# Patient Record
Sex: Male | Born: 1955 | Race: White | Hispanic: No | Smoking: Never smoker
Health system: Southern US, Community
[De-identification: ages and names within clinical notes are randomized; demographics above are authoritative.]

## PROBLEM LIST (undated history)

## (undated) DIAGNOSIS — E669 Obesity, unspecified: Secondary | ICD-10-CM

## (undated) DIAGNOSIS — Z87442 Personal history of urinary calculi: Secondary | ICD-10-CM

## (undated) DIAGNOSIS — Z113 Encounter for screening for infections with a predominantly sexual mode of transmission: Secondary | ICD-10-CM

## (undated) DIAGNOSIS — F3289 Other specified depressive episodes: Secondary | ICD-10-CM

## (undated) DIAGNOSIS — Z0000 Encounter for general adult medical examination without abnormal findings: Secondary | ICD-10-CM

## (undated) DIAGNOSIS — M5412 Radiculopathy, cervical region: Secondary | ICD-10-CM

## (undated) DIAGNOSIS — F329 Major depressive disorder, single episode, unspecified: Secondary | ICD-10-CM

## (undated) DIAGNOSIS — R209 Unspecified disturbances of skin sensation: Secondary | ICD-10-CM

## (undated) DIAGNOSIS — I10 Essential (primary) hypertension: Secondary | ICD-10-CM

## (undated) DIAGNOSIS — E291 Testicular hypofunction: Secondary | ICD-10-CM

## (undated) DIAGNOSIS — G4700 Insomnia, unspecified: Secondary | ICD-10-CM

## (undated) DIAGNOSIS — R0789 Other chest pain: Secondary | ICD-10-CM

## (undated) HISTORY — DX: Personal history of urinary calculi: Z87.442

## (undated) HISTORY — DX: Encounter for screening for infections with a predominantly sexual mode of transmission: Z11.3

## (undated) HISTORY — DX: Unspecified disturbances of skin sensation: R20.9

## (undated) HISTORY — DX: Essential (primary) hypertension: I10

## (undated) HISTORY — DX: Other chest pain: R07.89

## (undated) HISTORY — DX: Encounter for general adult medical examination without abnormal findings: Z00.00

## (undated) HISTORY — DX: Other specified depressive episodes: F32.89

## (undated) HISTORY — DX: Testicular hypofunction: E29.1

## (undated) HISTORY — DX: Radiculopathy, cervical region: M54.12

## (undated) HISTORY — DX: Insomnia, unspecified: G47.00

## (undated) HISTORY — DX: Major depressive disorder, single episode, unspecified: F32.9

## (undated) HISTORY — DX: Obesity, unspecified: E66.9

---
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
## 2005-02-13 ENCOUNTER — Ambulatory Visit (HOSPITAL_COMMUNITY): Admission: RE | Admit: 2005-02-13 | Discharge: 2005-02-13 | Payer: Self-pay | Admitting: Neurology

## 2008-11-03 ENCOUNTER — Encounter: Payer: Self-pay | Admitting: Family Medicine

## 2008-11-03 LAB — CONVERTED CEMR LAB
ALT: 25 units/L
AST: 18 units/L
Albumin: 4.7 g/dL
Alkaline Phosphatase: 84 units/L
BUN: 15 mg/dL
Calcium: 9.4 mg/dL
Cholesterol: 151 mg/dL
Creatinine, Ser: 0.89 mg/dL
HDL: 49 mg/dL
LDL Cholesterol: 59 mg/dL
PSA: 0.47 ng/mL
Total Bilirubin: 0.6 mg/dL
Total Protein: 7.2 g/dL
Triglycerides: 216 mg/dL

## 2009-05-26 LAB — HM COLONOSCOPY: HM Colonoscopy: NORMAL

## 2009-09-21 ENCOUNTER — Encounter: Payer: Self-pay | Admitting: Family Medicine

## 2009-09-21 LAB — CONVERTED CEMR LAB
Chloride: 107 meq/L
HCT: 43.8 %
Hemoglobin: 15 g/dL
Platelets: 269 10*3/uL
Potassium: 4.3 meq/L
RBC: 4.82 M/uL
Sodium: 141 meq/L
T3 Uptake Ratio: 30.4 %
T4, Total: 7.4 ug/dL
TSH: 1.275 microintl units/mL
WBC: 8.2 10*3/uL

## 2009-12-13 ENCOUNTER — Ambulatory Visit: Payer: Self-pay | Admitting: Internal Medicine

## 2009-12-13 DIAGNOSIS — I10 Essential (primary) hypertension: Secondary | ICD-10-CM

## 2009-12-13 DIAGNOSIS — F329 Major depressive disorder, single episode, unspecified: Secondary | ICD-10-CM

## 2009-12-13 DIAGNOSIS — G4700 Insomnia, unspecified: Secondary | ICD-10-CM

## 2009-12-13 DIAGNOSIS — R209 Unspecified disturbances of skin sensation: Secondary | ICD-10-CM | POA: Insufficient documentation

## 2009-12-14 ENCOUNTER — Telehealth: Payer: Self-pay | Admitting: Family Medicine

## 2009-12-17 ENCOUNTER — Encounter: Payer: Self-pay | Admitting: Family Medicine

## 2010-01-17 ENCOUNTER — Ambulatory Visit: Payer: Self-pay | Admitting: Internal Medicine

## 2010-02-08 ENCOUNTER — Ambulatory Visit: Payer: Self-pay | Admitting: Internal Medicine

## 2010-02-08 LAB — CONVERTED CEMR LAB
BUN: 17 mg/dL (ref 6–23)
CO2: 29 meq/L (ref 19–32)
Calcium: 9.5 mg/dL (ref 8.4–10.5)
Chloride: 108 meq/L (ref 96–112)
Cholesterol: 187 mg/dL (ref 0–200)
Creatinine, Ser: 1 mg/dL (ref 0.4–1.5)
GFR calc non Af Amer: 83.1 mL/min (ref >60)
Glucose, Bld: 104 mg/dL — ABNORMAL HIGH (ref 70–99)
HDL: 50.4 mg/dL (ref >39.00)
LDL Cholesterol: 109 mg/dL — ABNORMAL HIGH (ref 0–99)
Potassium: 4.2 meq/L (ref 3.5–5.1)
Sodium: 143 meq/L (ref 135–145)
Total CHOL/HDL Ratio: 4
Triglycerides: 137 mg/dL (ref 0.0–149.0)
VLDL: 27.4 mg/dL (ref 0.0–40.0)

## 2010-02-17 ENCOUNTER — Ambulatory Visit: Payer: Self-pay | Admitting: Internal Medicine

## 2010-02-17 DIAGNOSIS — R0789 Other chest pain: Secondary | ICD-10-CM

## 2010-02-17 DIAGNOSIS — E669 Obesity, unspecified: Secondary | ICD-10-CM

## 2010-04-22 ENCOUNTER — Encounter: Payer: Self-pay | Admitting: Family Medicine

## 2010-04-22 ENCOUNTER — Ambulatory Visit: Payer: Self-pay | Admitting: Family Medicine

## 2010-04-26 LAB — CONVERTED CEMR LAB
ALT: 27 units/L (ref 0–53)
AST: 27 units/L (ref 0–37)
Albumin: 4.1 g/dL (ref 3.5–5.2)
Alkaline Phosphatase: 74 units/L (ref 39–117)
BUN: 19 mg/dL (ref 6–23)
Bilirubin, Direct: 0.1 mg/dL (ref 0.0–0.3)
CO2: 31 meq/L (ref 19–32)
Calcium: 9.3 mg/dL (ref 8.4–10.5)
Chloride: 103 meq/L (ref 96–112)
Creatinine, Ser: 1 mg/dL (ref 0.4–1.5)
GFR calc non Af Amer: 78.45 mL/min (ref >60.00)
Glucose, Bld: 98 mg/dL (ref 70–99)
HCV Ab: NEGATIVE
HIV: NONREACTIVE
Hep A IgM: NEGATIVE
Hep B C IgM: NEGATIVE
Hepatitis B Surface Ag: NEGATIVE
Potassium: 3.8 meq/L (ref 3.5–5.1)
Sodium: 142 meq/L (ref 135–145)
Total Bilirubin: 0.8 mg/dL (ref 0.3–1.2)
Total Protein: 6.9 g/dL (ref 6.0–8.3)

## 2010-06-07 NOTE — Assessment & Plan Note (Signed)
Summary: ONE MTH F/U FOR HTN & DEPRESSION / LFW   Vital Signs:  Patient profile:   55 year old male Weight:      229.25 pounds BMI:     33.98 Temp:     98.2 degrees F oral Pulse rate:   72 / minute Pulse rhythm:   regular BP sitting:   138 / 80  (left arm) Cuff size:   large  Vitals Entered By: Selena Batten Dance CMA (AAMA) (February 17, 2010 8:08 AM)  Serial Vital Signs/Assessments:  Time      Position  BP       Pulse  Resp  Temp     By                     130/90                         Eustaquio Boyden  MD  CC: 1 month follow up   History of Present Illness: CC: f/u HTN  1. insomnia - sleeping better even off ambien.  will continue trying to wean.  2. Depression - off celexa for last 2-3 days.  thinks would like to taper off med and see how he does with depression off meds.  previously 2/2 wife's passing.  3. obesity - drinks tea and lemonade.    4. HTN - no HA, vision changes, chest pain, tightness, urinary changes, LE swelling.  + right rib pain when laying on right side in bed.  comes and goes away easily.  achey.  positional.  5. hand numbness improved.  Allergies: No Known Drug Allergies  Past History:  Family History: Last updated: 12/13/2009 Father - HTN?, EtOH, Parkinson's Disease, 57yo deceased  No CA, DM, CAD/MI, CVA.  Social History: Last updated: 12/17/2009 No smoking, occ EtOH, no rec drugs. Wife passed away suddenly Nov 19, 2008 from cirrhosis/fulminant acute liver failure (from Hep C?).  Since then pt with sleep issues and depression. 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 EOMI. Perrla. Mouth:  Oral mucosa and oropharynx without lesions or exudates.  Teeth in good repair. Neck:  No deformities, masses, or tenderness noted.  no thyromegaly.  no bruits Lungs:  Normal respiratory effort, chest expands symmetrically. Lungs are clear to auscultation, no crackles or wheezes. Heart:  Normal rate and regular rhythm. S1 and S2 normal without gallop, murmur, click, rub or other extra sounds. Pulses:  2+ periph pulses   Impression & Recommendations:  Problem # 1:  INSOMNIA (ICD-780.52) improving off ambien.  rec wean off. His updated medication list for this problem includes:    Ambien 10 Mg Tabs (Zolpidem tartrate) .Marland Kitchen... 1 by mouth at bedtime  Problem # 2:  HYPERTENSION (ICD-401.9) reviewed blood work and provided with copy.  continue HCTZ as helping BP.  His updated medication list for this problem includes:    Hydrochlorothiazide 12.5 Mg Caps (Hydrochlorothiazide) .Marland Kitchen... Take one daily in am for blood pressure  BP today: 138/80 Prior BP: 150/90 (01/17/2010)  Labs Reviewed:  K+: 4.2 (02/08/2010) Creat: : 1.0 (02/08/2010)   Chol: 187 (02/08/2010)   HDL: 50.40 (02/08/2010)   LDL: 109 (02/08/2010)   TG: 137.0 (02/08/2010)  Problem # 3:  DEPRESSION (ICD-311) pt wants to taper of med.  discussed tapering schedule for celexa. His updated medication list for this problem includes:    Citalopram Hydrobromide 20 Mg Tabs (Citalopram hydrobromide) .Marland Kitchen... 1 once daily  Problem # 4:  NUMBNESS, HAND (ICD-782.0) improved.  Problem # 5:  CHEST PAIN, ATYPICAL (ICD-786.59) noncardiac in nature, not exertional, not relieved by rest.  sounds more MSK, monitor for now.  Problem # 6:  OBESITY (ICD-278.00)  to try artificial sweeteners, cut back on sugars and increase water.  interested in weight loss.  prediabetes by fasting cbg 104.  Ht: 69.0 (12/13/2009)    Wt: 229.25 (02/17/2010)   BMI: 33.98 (02/17/2010)  Complete Medication List: 1)  Citalopram Hydrobromide 20 Mg Tabs (Citalopram hydrobromide) .Marland Kitchen.. 1 once daily 2)  Ambien 10 Mg Tabs (Zolpidem tartrate) .Marland Kitchen.. 1 by mouth at bedtime 3)  Hydrochlorothiazide 12.5 Mg Caps (Hydrochlorothiazide) .... Take one daily in am for blood pressure  Patient Instructions: 1)  return in 3-6 months for follow up, sooner if needed. 2)  cut back on celexa to 1/2 pill for a week then 1/2 pill every other day for a week then stop. 3)  Continue HCTZ for blood pressure.   4)  Good to see you today, call clinic with quesitons.   Current Allergies (reviewed today): No known allergies

## 2010-06-07 NOTE — Assessment & Plan Note (Signed)
Summary: new patient to establish/alc   Vital Signs:  Patient profile:   55 year old male Height:      69.0 inches Weight:      229 pounds BMI:     33.94 Temp:     99.0 degrees F oral Pulse rate:   76 / minute Pulse rhythm:   regular BP sitting:   130 / 84  (left arm) Cuff size:   large  Vitals Entered By: Selena Batten Dance CMA Duncan Dull) (December 13, 2009 9:24 AM) CC: New patient to establish   History of Present Illness: CC: establish care, new patient  1. insomnia - previously oleptra/trazodone but didn't help.  Now on ambien and doing well as far as going to sleep, but waking up at 3am daily.  Started since wife passed.  2. depression - since wife passed.  + trouble sleeping.  + anhedonia.  + guilt.  decreased energy level.  Normal concentration.  Appetite OK.  No SI/HI.  On 20mg  daily of Celexa.  Going to grievance counseling through Hospice.  Has appt for Wed.  Going to breakfast mens' club once a month.  3. HTN - started since wife passed.  Was on telmisartan/amlodipine samples from prior provider.  no HA, vision changes, chest pain, tightness, SOB, urinary changes, LE swelling.  4. left fingers numb - started several months ago.  Started with pain from shoulder down arm, now just numb.  No weakness, elbow pain.  Worse with lifting hand.  Thinks elbow rests on padded support.  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  Wife passed 12-13-08 (quite suddenly, dx with ESLD 11-13-2008, died 1 mo later).  Still coping with loss.  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  Daughter married in [place].  Poor social support. Trying to move on, has girlfriend (for 57mo).  Feb went to life screen and told everything normal (AAA [place], ABIs, etc).  Preventive Screening-Counseling & Management  Alcohol-Tobacco     Alcohol drinks/day: <1     Smoking Status: never  Caffeine-Diet-Exercise     Caffeine use/day: 2-3/day  Safety-Violence-Falls     Seat  Belt Use: no     Seat Belt Counseling: to use seat belts when in vehicle      Drug Use:  never.        Blood Transfusions:  no.    Current Medications (verified): 1)  Twynsta 80-10 Mg Tabs (Telmisartan-Amlodipine) .... 1/2 Tablet Once Daily 2)  Citalopram Hydrobromide 20 Mg Tabs (Citalopram Hydrobromide) .Marland Kitchen.. 1 Once Daily 3)  Zolpidem Tartrate 10 Mg Tabs (Zolpidem Tartrate) .Marland Kitchen.. 1 At Bedtime As Needed  Allergies (verified): No Known Drug Allergies  Past History:  Past Medical History: HTN Insomnia since wife passed Depression h/o nephrolithiasis  Past Surgical History: T&A 1962  scrotal US - 9/22 with ?cystocele  Family History: Father - HTN?, EtOH, Parkinson's Disease, 57yo deceased  No CA, DM, CAD/MI, CVA.  Social History: No smoking, occ EtOH, no rec drugs. Wife passed away suddenly 2008/12/13 from cirrhosis/fulminant acute liver failure.  Since then pt with sleep issues and depression. 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  The patient denies anorexia, fever, weight loss, weight gain, vision loss, decreased hearing, hoarseness, chest pain,  syncope, dyspnea on exertion, peripheral edema, prolonged cough, headaches, hemoptysis, abdominal pain, melena, hematochezia, severe indigestion/heartburn, hematuria, incontinence, genital sores, muscle weakness, suspicious skin lesions, transient blindness, difficulty walking, unusual weight change, abnormal bleeding, angioedema, and testicular masses.    Physical Exam  General:  Well-developed,well-nourished,in no acute distress; alert,appropriate and cooperative throughout examination Head:  Normocephalic and atraumatic without obvious abnormalities. No apparent alopecia or balding. Eyes:  No corneal or conjunctival inflammation noted. EOMI.  Perrla. Mouth:  Oral mucosa and oropharynx without lesions or exudates.  Teeth in good repair. Neck:  No deformities, masses, or tenderness noted.  no thyromegaly Lungs:  Normal respiratory effort, chest expands symmetrically. Lungs are clear to auscultation, no crackles or wheezes. Heart:  Normal rate and regular rhythm. S1 and S2 normal without gallop, murmur, click, rub or other extra sounds. Abdomen:  Bowel sounds positive,abdomen soft and non-tender without masses, organomegaly or hernias noted. Msk:  No deformity or scoliosis noted of thoracic or lumbar spine.    Left hand - no muscle wasting.  + numbness to light touch left 5th finger palmar and dorsal sides, 4th finger palmar side, also ulnar side of palm.  Strength 5/5 bilateral hands.  Right hand - WNL Pulses:  2+ periph pulses Extremities:  No clubbing, cyanosis, edema, or deformity noted with normal full range of motion of all joints.   Neurologic:  see above.   Impression & Recommendations:  Problem # 1:  HYPERTENSION (ICD-401.9) obtain records from Dr. Carron Brazen office. His updated medication list for this problem includes:    Zestoretic 10-12.5 Mg Tabs (Lisinopril-hydrochlorothiazide) .Marland Kitchen... Take one daily for blood pressure.  BP today: 130/84  Problem # 2:  DEPRESSION (ICD-311) meets criteria for depression.  continue celexa.  consider CBT next visit.  pt seems open to this.  currently receiving grief counseling through hospice. His updated medication list for this problem includes:    Citalopram Hydrobromide 20 Mg Tabs (Citalopram hydrobromide) .Marland Kitchen... 1 once daily  Problem # 3:  INSOMNIA (ICD-780.52) trouble with sleep maintenance.  trial of long acting ambien.  discuss sleep hygiene at next visit, consider trazodone trial again with titration up. His updated medication list for this problem includes:    Zolpidem Tartrate 12.5 Mg Cr-tabs (Zolpidem tartrate) ..... 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  check sperling's next visit to help r/o cervical, but don't think coming from back.  treat with ensuring good padding at elbow, minimize elbow flexion.    Problem # 5:  Preventive Health Care (ICD-V70.0) per pt colonoscopy last year.  obtain records.  due for prostate discussion as well.  check last tetanus.  Complete Medication List: 1)  Zestoretic 10-12.5 Mg Tabs (Lisinopril-hydrochlorothiazide) .... Take one daily for blood pressure. 2)  Citalopram Hydrobromide 20 Mg Tabs (Citalopram hydrobromide) .Marland Kitchen.. 1 once daily 3)  Zolpidem Tartrate 12.5 Mg Cr-tabs (Zolpidem tartrate) .... One by mouth at bedtime as needed insomnia  Patient Instructions: 1)  Release of information for colonosocopy results Camera operator in [place]) and records from Dr. Lacie Scotts. 2)  Please return to see me in 1 month for follow up of medicines and HTN. 3)  Start medicine HCTZ 12.5mg  daily for blood pressure. 4)  Continue celexa. 5)  Pleasure to meet you today.  Call clinic with questions. Prescriptions: ZOLPIDEM TARTRATE 12.5 MG CR-TABS (ZOLPIDEM TARTRATE) one by mouth at bedtime as needed insomnia  #30 x 0   Entered  and Authorized by:   Eustaquio Boyden  MD   Signed by:   Eustaquio Boyden  MD on 12/13/2009   Method used:   Print then Give to Patient   RxID:   [id number] ZESTORETIC 10-12.5 MG TABS (LISINOPRIL-HYDROCHLOROTHIAZIDE) take one daily for blood pressure.  #30 x 2   Entered and Authorized by:   Eustaquio Boyden  MD   Signed by:   Eustaquio Boyden  MD on 12/13/2009   Method used:   Electronically to        [facility] [address] [phone]* (retail)       [address] [place], [place]  [address]       Ph: [phone]       Fax: [phone]   RxID:   [id number]   Current Allergies (reviewed today): No known allergies    Prevention & Chronic Care Immunizations   Influenza vaccine: Not documented    Tetanus  booster: Not documented    Pneumococcal vaccine: Not documented  Colorectal Screening   Hemoccult: Not documented    Colonoscopy: Not documented  Other Screening   PSA: Not documented   Smoking status: never  (12/13/2009)  Lipids   Total Cholesterol: Not documented   LDL: Not documented   LDL Direct: Not documented   HDL: Not documented   Triglycerides: Not documented  Hypertension   Last Blood Pressure: 130 / 84  (12/13/2009)   Serum creatinine: Not documented   Serum potassium Not documented  Self-Management Support :    Hypertension self-management support: Not documented  Appended Document: colonoscopy report    Clinical Lists Changes  Observations: Added new observation of PAST SURG HX: T&A 1962  scrotal US - 9/22 with ?cystocele Colonoscopy 05/2009 WNL by Dr. Niel Hummer at [facility] S[facility] center (12/13/2009 17:28) Added new observation of HEMOCCRECACT: Not indicated (12/13/2009 17:28) Added new observation of DM PROGRESS: N/A (12/13/2009 17:28) Added new observation of DM FSREVIEW: N/A (12/13/2009 17:28) Added new observation of LIPID PROGRS: N/A (12/13/2009 17:28) Added new observation of LIPID FSREVW: N/A (12/13/2009 17:28) Added new observation of COLONNXTDUE: 06/2019 (12/13/2009 17:28) Added new observation of COLONRECACT: Repeat colonoscopy in 10 years.   (05/26/2009 17:29) Added new observation of COLONOSCOPY:  Results: Normal. Results: Internal Hemorrhoids.  (05/26/2009 17:29)        Past History:  Past Surgical History: T&A 1962  scrotal US - 9/22 with ?cystocele Colonoscopy 05/2009 WNL by Dr. Niel Hummer at [facility] Surg center   Colonoscopy  Procedure date:  05/26/2009  Findings:       Results: Normal. Results: Internal Hemorrhoids.   Comments:      Repeat colonoscopy in 10 years.    Procedures Next Due Date:    Colonoscopy: 06/2019    Prevention & Chronic Care Immunizations   Influenza vaccine: Not  documented    Tetanus booster: Not documented    Pneumococcal vaccine: Not documented  Colorectal Screening   Hemoccult: Not documented   Hemoccult action/deferral: Not indicated  (12/13/2009)    Colonoscopy:  Results: Normal. Results: Internal Hemorrhoids.   (05/26/2009)   Colonoscopy action/deferral: Repeat colonoscopy in 10 years.    (05/26/2009)   Colonoscopy due: 06/2019  Other Screening   PSA: Not documented   Smoking status: never  (12/13/2009)  Lipids   Total Cholesterol: Not documented   LDL: Not documented   LDL Direct: Not documented   HDL: Not documented   Triglycerides: Not documented  Hypertension   Last Blood  Pressure: 130 / 84  (12/13/2009)   Serum creatinine: Not documented   Serum potassium Not documented  Self-Management Support :    Hypertension self-management support: Not documented

## 2010-06-07 NOTE — Miscellaneous (Signed)
Summary: needs FLP, BMP next visit  Clinical Lists Changes  Needs BMP, FLP at next visit.  Ryan Boyden  MD  December 17, 2009 10:44 AM   Observations: Added new observation of PAST SURG HX: T&A 1962  Cspine 1993 - mod degen changes with mild neural foraminal impingement C4-5 B, C5-6 L renal US 2004 - WNL scrotal US - 01/27/09 with ?cystocele Colonoscopy 05/2009 WNL by Dr. Niel Hummer at [facility] (12/17/2009 10:31) Added new observation of PAST MED HX: HTN Insomnia since wife passed Depression h/o borderline hypogonadism, normal on latest check per urology h/o nephrolithiasis h/o scrotal mass, seen by Urology Evelene Croon with [place], will monitor "fluid filled sack" (12/17/2009 10:31) Added new observation of SOCIAL HX: No smoking, occ EtOH, no rec drugs. Wife passed away suddenly November 27, 2008 from cirrhosis/fulminant acute liver failure (from Hep C?).  Since then pt with sleep issues and depression. 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) Added new observation of PLATELETK/UL: 269 K/uL (09/21/2009 10:31) Added new observation of HCT: 43.8 % (09/21/2009 10:31) Added new observation of HGB: 15.0 g/dL (16/02/9603 54:09) Added new observation of RBC M/UL: 4.82 M/uL (09/21/2009 10:31) Added new observation of WBC COUNT: 8.2 10*3/microliter (09/21/2009 10:31) Added new observation of TSH: 1.275 microintl units/mL (09/21/2009 10:31) Added new observation of T3 UPTAKE S: 30.4 % (09/21/2009 10:31) Added new observation of T4, TOTAL: 7.4 mcg/dL (81/19/1478 29:56) Added new observation of CL SERUM: 107 meq/L (09/21/2009 10:31) Added new observation of K SERUM: 4.3 meq/L (09/21/2009 10:31) Added new observation of NA: 141 meq/L (09/21/2009 10:31) Added new observation of PSA: 0.47 ng/mL (11/03/2008 10:31) Added new observation of CALCIUM: 9.4 mg/dL (21/30/8657 84:69) Added new observation of ALBUMIN: 4.7 g/dL (62/95/2841 32:44) Added new observation of PROTEIN,  TOT: 7.2 g/dL (05/10/7251 66:44) Added new observation of SGPT (ALT): 25 units/L (11/03/2008 10:31) Added new observation of SGOT (AST): 18 units/L (11/03/2008 10:31) Added new observation of ALK PHOS: 84 units/L (11/03/2008 10:31) Added new observation of BILI TOTAL: 0.6 mg/dL (03/47/4259 56:38) Added new observation of CREATININE: 0.89 mg/dL (75/64/3329 51:88) Added new observation of BUN: 15 mg/dL (41/66/0630 16:01) Added new observation of LDL: 59 mg/dL (09/32/3557 32:20) Added new observation of HDL: 49 mg/dL (25/42/7062 37:62) Added new observation of TRIGLYC TOT: 216 mg/dL (83/15/1761 60:73) Added new observation of CHOLESTEROL: 151 mg/dL (71/10/2692 85:46) Testosterone, Total: 264.27 ng/dl (27/07/5007)       Past History:  Past Medical History: HTN Insomnia since wife passed Depression h/o borderline hypogonadism, normal on latest check per urology h/o nephrolithiasis h/o scrotal mass, seen by Urology Evelene Croon with [place], will monitor "fluid filled sack"  Past Surgical History: T&A 1962  Cspine 1993 - mod degen changes with mild neural foraminal impingement C4-5 B, C5-6 L renal US 2004 - WNL scrotal US - 01/27/09 with ?cystocele Colonoscopy 05/2009 [facility] by [facility]. Niel Hummer at [facility] center   Social History: No smoking, occ EtOH, no rec drugs. Wife passed away suddenly November 27, 2008 from cirrhosis/fulminant acute liver failure (from Hep C?).  Since then pt with sleep issues and depression. 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

## 2010-06-07 NOTE — Progress Notes (Signed)
Summary: Remus Loffler cr is too expensive  Phone Note Call from Patient Call back at Home Phone [phone] Call back at [phone]   Caller: Patient Call For: Eustaquio Boyden  MD Summary of Call: Pt was given script for ambien cr but this is too expensive.  He is asking if something less costly can be called to [facility] [place].  Please let pt know. Initial call taken by: Lowella Petties CMA,  December 14, 2009 2:04 PM  Follow-up for Phone Call        Stickney CR, lunesta too expensive.  consider trial silenor in future (although likely expensive as well).  Trial of amitriptyline at bedtime as needed.     Follow-up by: Eustaquio Boyden  MD,  December 14, 2009 10:23 PM  Additional Follow-up for Phone Call Additional follow up Details #1::        We have samples of the Silenor 3mg  and 6mg . Do you want me to give him samples of that to see how it works for him or just call in the amitriptyline to his pharmacy?   Nvm- i just saw where you had sent it yourself!  Additional Follow-up by: Janee Morn CMA Duncan Dull),  December 15, 2009 8:08 AM    Additional Follow-up for Phone Call Additional follow up Details #2::    can we call him and let him know?  thanks.  Left message on machine to return call. Kim Dance CMA Duncan Dull)  December 15, 2009 8:17 AM  Follow-up by: Eustaquio Boyden  MD,  December 15, 2009 8:14 AM  Additional Follow-up for Phone Call Additional follow up Details #3:: Details for Additional Follow-up Action Taken: Spoke with patient and made him aware of the new Rx. Additional Follow-up by: Janee Morn CMA (AAMA),  December 15, 2009 4:00 PM  New/Updated Medications: AMITRIPTYLINE HCL 25 MG TABS (AMITRIPTYLINE HCL) one by mouth at bedtime as needed insomnia Prescriptions: AMITRIPTYLINE HCL 25 MG TABS (AMITRIPTYLINE HCL) one by mouth at bedtime as needed insomnia  #30 x 1   Entered and Authorized by:   Eustaquio Boyden  MD   Signed by:   Eustaquio Boyden  MD on 12/14/2009   Method used:    Electronically to        [facility] [address] [phone]* (retail)       [address] [place]  [address]       Ph: [phone]       Fax: [phone]   RxID:   [id number]

## 2010-06-07 NOTE — Assessment & Plan Note (Signed)
Summary: ONE MTH F/U OF MEDS and HTN / LFW   Vital Signs:  Patient profile:   55 year old male Weight:      230.50 pounds Temp:     98.7 degrees F oral Pulse rate:   84 / minute Pulse rhythm:   regular BP sitting:   150 / 90  (left arm) Cuff size:   large  Vitals Entered By: Selena Batten Dance CMA Duncan Dull) (January 17, 2010 9:09 AM)  Serial Vital Signs/Assessments:  Time      Position  BP       Pulse  Resp  Temp     By                     160/96                         Eustaquio Boyden  MD  CC: 1 month follow up   History of Present Illness: CC: f/u HTN  1. 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  Previously on benicar but didn't work enough so previous PCP had given him telmisartan/amlodipine samples.  no HA, vision changes, chest pain, tightness, urinary changes, LE swelling.    2. Depression - since wife passed >1 year ago now.  Improved sleep, improved guilt.  still with anhedonia.  decreased energy level.  Normal concentration.  Appetite OK.  No SI/HI.  On 20mg  daily of Celexa.  Stopped grievance counseling through [facility].  Stoppd going to breakfast mens[employer] club (was yongest one there, didn't feel was really getting much out of it).  3. insomnia - previously on oleptra/trazodone but didn't help.  Now on [place].  Sleep actually notices improvement - not using as frequently.  will try and wean off but would like refill.  didn't fill amitriptyline because read about it being antidepressant, already on celexa.  Wife passed 11/2008 (quite suddenly, dx with ESLD 11-04-2008, died 1 mo later).  Still coping with loss.  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  Daughter married in [place].  Poor social support. Trying to move on, has girlfriend (for 72mo).  Feb went to life screen and told everything normal (AAA [place], ABIs, etc).  Current Medications (verified): 1)  Citalopram Hydrobromide 20 Mg Tabs (Citalopram Hydrobromide) .Marland Kitchen.. 1 Once Daily 2)  Ambien 10  Mg Tabs (Zolpidem Tartrate) .Marland Kitchen.. 1 By Mouth At Bedtime 3)  Hydrochlorothiazide 12.5 Mg Caps (Hydrochlorothiazide) .... Take One Daily in Am For Blood Pressure  Allergies (verified): No Known Drug Allergies  Past History:  Past medical, surgical, family and social histories (including risk factors) reviewed for relevance to current acute and chronic problems.  Past Medical History: HTN Depression    Insomnia since wife passed MILD hypertriglyceridemia h/o borderline hypogonadism, normal on latest check per urology h/o nephrolithiasis h/o scrotal mass, seen by Urology Evelene Croon with [place], will monitor "fluid filled sack"  Past Surgical History: Reviewed history from 12/17/2009 and no changes required. T&A 1962  Cspine 1993 - mod degen changes with mild neural foraminal impingement C4-5 B, C5-6 L renal US 2004 - WNL scrotal US - 01/27/09 with ?cystocele Colonoscopy 05/2009 WNL by Dr. Niel Hummer at [facility] center  Social History: Reviewed history from 12/17/2009 and no changes required. No smoking, occ EtOH, no rec drugs. Wife passed away suddenly 12/03/08 from cirrhosis/fulminant acute liver failure (from Hep C?).  Since then pt with sleep issues and depression.  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 Lungs are clear to auscultation, no crackles or wheezes. Heart:  Normal rate and regular rhythm. S1 and S2 normal without gallop, murmur, click, rub or other extra sounds. Pulses:  2+ periph pulses Psych:  Cognition and judgment appear intact.  No apparent delusions, illusions, hallucinations.  tired appearing, mildly flattened affect.   Impression & Recommendations:  Problem # 1:  HYPERTENSION (ICD-401.9) zestoretic too strong.  BP  remains elevated.  Start slow, low with HCTZ 12.5.  RTC 3 wks for blood work to eval Ca.  The following medications were removed from the medication list:    Zestoretic 10-12.5 Mg Tabs (Lisinopril-hydrochlorothiazide) .Marland Kitchen... Take one daily for blood pressure. His updated medication list for this problem includes:    Hydrochlorothiazide 12.5 Mg Caps (Hydrochlorothiazide) .Marland Kitchen... Take one daily in am for blood pressure  BP today: 150/90 Prior BP: 130/84 (12/13/2009)  Labs Reviewed: K+: 4.3 (09/21/2009) Creat: : 0.89 (11/03/2008)   Chol: 151 (11/03/2008)   HDL: 49 (11/03/2008)   LDL: 59 (11/03/2008)   TG: 216 (11/03/2008)  Problem # 2:  DEPRESSION (ICD-311) continue celexa.  pt will give grieving more time.  discussed other options of increasing celexa vs adjunctive treatment vs CBT.    The following medications were removed from the medication list:    Amitriptyline Hcl 25 Mg Tabs (Amitriptyline hcl) ..... One by mouth at bedtime as needed insomnia His updated medication list for this problem includes:    Citalopram Hydrobromide 20 Mg Tabs (Citalopram hydrobromide) .Marland Kitchen... 1 once daily  Problem # 3:  INSOMNIA (ICD-780.52) ambien CR too expensive.  Actually sleep better.  requests refill of Remus Loffler, will try to taper off. consider trazodone with titration upward His updated medication list for this problem includes:    Ambien 10 Mg Tabs (Zolpidem tartrate) .Marland Kitchen... 1 by mouth at bedtime  Problem # 4:  Preventive Health Care (ICD-V70.0) due for tetanus, flu.  Complete Medication List: 1)  Citalopram Hydrobromide 20 Mg Tabs (Citalopram hydrobromide) .Marland Kitchen.. 1 once daily 2)  Ambien 10 Mg Tabs (Zolpidem tartrate) .Marland Kitchen.. 1 by mouth at bedtime 3)  Hydrochlorothiazide 12.5 Mg Caps (Hydrochlorothiazide) .... Take one daily in am for blood pressure  Patient Instructions: 1)  return in 3 weeks fasting for blood work [BMP, FLP 401.1]  2)  return in 1 month for f/u HTN, depression. 3)  Pleasure to see you  today.  call clinic with questions. Prescriptions: CITALOPRAM HYDROBROMIDE 20 MG TABS (CITALOPRAM HYDROBROMIDE) 1 once daily  #30 x 3   Entered and Authorized by:   Eustaquio Boyden  MD   Signed by:   Eustaquio Boyden  MD on 01/17/2010   Method used:   Electronically to        [facility] [address] [phone]* (retail)       [address] [place], [place]  [address]       Ph: [phone]       Fax: [phone]   RxID:   [id number] AMBIEN 10 MG TABS (ZOLPIDEM TARTRATE) 1 by mouth at bedtime  #30 x 0   Entered and Authorized by:   Eustaquio Boyden  MD   Signed by:   Eustaquio Boyden  MD on 01/17/2010   Method used:  Print then Give to Patient   RxID:   [id number] AMBIEN 10 MG TABS (ZOLPIDEM TARTRATE) 1 by mouth at bedtime  #0 x 0   Entered and Authorized by:   Eustaquio Boyden  MD   Signed by:   Eustaquio Boyden  MD on 01/17/2010   Method used:   Print then Give to Patient   RxID:   [id number] HYDROCHLOROTHIAZIDE 12.5 MG CAPS (HYDROCHLOROTHIAZIDE) take one daily in am for blood pressure  #30 x 2   Entered and Authorized by:   Eustaquio Boyden  MD   Signed by:   Eustaquio Boyden  MD on 01/17/2010   Method used:   Electronically to        [facility] [address] [phone]* (retail)       [address] [place], [place]  [address]       Ph: [phone]       Fax: [phone]   RxID:   [id number]   Current Allergies (reviewed today): No known allergies    Prevention & Chronic Care Immunizations   Influenza vaccine: Not documented    Tetanus booster: Not documented    Pneumococcal vaccine: Not documented  Colorectal Screening   Hemoccult: Not documented   Hemoccult action/deferral: Not indicated  (12/13/2009)    Colonoscopy:  Results: Normal. Results: Internal Hemorrhoids.   (05/26/2009)   Colonoscopy action/deferral: Repeat colonoscopy in 10 years.    (05/26/2009)   Colonoscopy due: 06/2019  Other Screening   PSA: 0.47   (11/03/2008)   Smoking status: never  (12/13/2009)  Lipids   Total Cholesterol: 151  (11/03/2008)   LDL: 59  (11/03/2008)   LDL Direct: Not documented   HDL: 49  (11/03/2008)   Triglycerides: 216  (11/03/2008)  Hypertension   Last Blood Pressure: 150 / 90  (01/17/2010)   Serum creatinine: 0.89  (11/03/2008)   Serum potassium 4.3  (09/21/2009)  Self-Management Support :    Hypertension self-management support: Not documented

## 2010-06-09 NOTE — Assessment & Plan Note (Signed)
Summary: DISCUSS HEP B TESTING/ lb   Vital Signs:  Patient profile:   55 year old male Height:      69.0 inches Weight:      235.50 pounds BMI:     34.90 Temp:     98.7 degrees F oral Pulse rate:   84 / minute Pulse rhythm:   regular BP sitting:   136 / 82  (left arm) Cuff size:   large  Vitals Entered By: Lewanda Rife LPN (April 22, 2010 12:21 PM) CC: Discuss Hepatitis C testing   History of Present Illness: C: f/u, discuss Hep C   1. depression - celexa 20mg  1/2 every other day.  for last week.  Sleeping ok.  To do one more week and then stop and see how he does.  2. HTN - taking HTCZ 12.5mg  daily.  no HA, vision changes, chest pain, tightness, urinary changes, LE swelling.  3. requests Hep C testing - wife with "autoimmune hep C" died from ESLD 10 days ago.  would like Hep screen.  currently sexually active - GF with hysterectomy, not using protection.  01/2010 had spots on penis.  Checked by Dr. Gailen Shelter, done [place] for hydrocele, told had yeast infection, resolved with antifungal.  No current spots on skin.  no h/o STDs.  No dysuria or discharge.  No fevers/chills, no further skin lesions.  Allergies (verified): No Known Drug Allergies  Past History:  Past Medical History: HTN Depression    Insomnia since wife passed h/o borderline hypogonadism, normal on latest check per urology h/o nephrolithiasis h/o what sounds like hydrocele [Wolff] h/o R brachial neuritis resolved [Weymann]  Past Surgical History: T&A 1962  Cspine 1993 - mod degen changes with mild neural foraminal impingement C4-5 B, C5-6 L renal US 2004 - WNL scrotal US - 01/27/09 with ?cystocele per report Colonoscopy 05/2009 WNL by Dr. Niel Hummer at [facility] center  Social History: No smoking, occ EtOH, no rec drugs. Wife passed away suddenly 2008-11-29 from cirrhosis/fulminant acute liver failure (from Hep C vs autoimmune?).  Since then pt with sleep issues and depression. Local farmer  raises cows and soybean  Review of Systems       per HPI  Physical Exam  General:  Well-developed,well-nourished,in no acute distress; alert,appropriate and cooperative throughout examination Psych:  Cognition and judgment appear intact.  No apparent delusions, illusions, hallucinations.  brighter affect today, tears with discussion of wife's passing   Impression & Recommendations:  Problem # 1:  SCREENING EXAMINATION FOR VENEREAL DISEASE (ICD-V74.5) Assessment New check HIV, RPR, hep panel per patient's request.  as well as CMP (last done 2010). Orders: T-HIV Antibody  (Reflex) [id number]) T-Hepatitis Acute Panel ([id number]) T-RPR (Syphilis) (95638-75643)  Problem # 2:  HYPERTENSION (ICD-401.9) Assessment: Improved continue HCTZ daily.  stable on med.  His updated medication list for this problem includes:    Hydrochlorothiazide 12.5 Mg Caps (Hydrochlorothiazide) .Marland Kitchen... Take one daily in am for blood pressure  Orders: TLB-BMP (Basic Metabolic Panel-BMET) (80048-METABOL) TLB-Hepatic/Liver Function Pnl (80076-HEPATIC)  BP today: 136/82 Prior BP: 138/80 (02/17/2010)  Labs Reviewed: K+: 4.2 (02/08/2010) Creat: : 1.0 (02/08/2010)   Chol: 187 (02/08/2010)   HDL: 50.40 (02/08/2010)   LDL: 109 (02/08/2010)   TG: 137.0 (02/08/2010)  Problem # 3:  DEPRESSION (ICD-311) Assessment: Improved improved with time.  pt tapering off.  stop celexa next week.   His updated medication list for this problem includes:    Citalopram Hydrobromide 20 Mg Tabs (Citalopram hydrobromide) .Marland Kitchen... 1/2  tablet by mouth every other day.  Complete Medication List: 1)  Citalopram Hydrobromide 20 Mg Tabs (Citalopram hydrobromide) .... 1/2 tablet by mouth every other day. 2)  Hydrochlorothiazide 12.5 Mg Caps (Hydrochlorothiazide) .... Take one daily in am for blood pressure 3)  Ibuprofen 200 Mg Tabs (Ibuprofen) .... Otc as directed.  Patient Instructions: 1)  STD screen today - HIV, syphillis,  hepatitis. 2)  celexa 1/2 every other day for next week then stop. 3)  Good to see you today, call clinic with quesitons. Prescriptions: HYDROCHLOROTHIAZIDE 12.5 MG CAPS (HYDROCHLOROTHIAZIDE) take one daily in am for blood pressure  #30 x 5   Entered and Authorized by:   Eustaquio Boyden  MD   Signed by:   Eustaquio Boyden  MD on 04/22/2010   Method used:   Print then Give to Patient   RxID:   [id number]    Orders Added: 1)  TLB-BMP (Basic Metabolic Panel-BMET) [80048-METABOL] 2)  TLB-Hepatic/Liver Function Pnl [80076-HEPATIC] 3)  T-HIV Antibody  (Reflex) [[id number]] 4)  T-Hepatitis Acute Panel [80074-22940] 5)  T-RPR (Syphilis) [30865-78469] 6)  Est. Patient Level III [62952]    Current Allergies (reviewed today): No known allergies   Appended Document: DISCUSS HEP B TESTING/ lb

## 2010-06-14 ENCOUNTER — Ambulatory Visit (INDEPENDENT_AMBULATORY_CARE_PROVIDER_SITE_OTHER): Payer: Self-pay | Admitting: Family Medicine

## 2010-06-14 ENCOUNTER — Encounter: Payer: Self-pay | Admitting: Family Medicine

## 2010-06-14 DIAGNOSIS — F329 Major depressive disorder, single episode, unspecified: Secondary | ICD-10-CM

## 2010-06-14 DIAGNOSIS — I10 Essential (primary) hypertension: Secondary | ICD-10-CM

## 2010-06-23 NOTE — Assessment & Plan Note (Signed)
Summary: 3-6 month f/u [facility]   Vital Signs:  Patient profile:   55 year old male Weight:      234.25 pounds Temp:     98.5 degrees F oral Pulse rate:   80 / minute Pulse rhythm:   regular BP sitting:   128 / 78  (left arm) Cuff size:   large  Vitals Entered By: Selena Batten Dance CMA Duncan Dull) (June 14, 2010 8:17 AM) CC: Follow up   History of Present Illness: CC: f/u HTN   1. HTN - no HA, vision changes, chest pain, tightness, urinary changes, LE swelling.    2. depression - off celexa.  doing well, no mood issues.  3. obesity - stable weight.  active job - works in farm.  no exercise otherwise.  preventative: colonoscopy 05/2009 all looking good.  told looking good for 10 years. teatnus - unsure when last one was.  declines currently.  working on Museum/gallery curator.   Current Medications (verified): 1)  Hydrochlorothiazide 12.5 Mg Caps (Hydrochlorothiazide) .... Take One Daily in Am For Blood Pressure 2)  Ibuprofen 200 Mg Tabs (Ibuprofen) .... Otc As Directed.  Allergies (verified): No Known Drug Allergies  Past History:  Past Medical History: Last updated: 04/22/2010 HTN Depression    Insomnia since wife passed h/o borderline hypogonadism, normal on latest check per urology h/o nephrolithiasis h/o what sounds like hydrocele [Wolff] h/o R brachial neuritis resolved [Weymann]  Family History: Last updated: 12/13/2009 Father - HTN?, EtOH, Parkinson's Disease, 57yo deceased  No CA, DM, CAD/MI, CVA.  Social History: Last updated: 04/22/2010 No smoking, occ EtOH, no rec drugs. Wife passed away suddenly 2008/11/22 from cirrhosis/fulminant acute liver failure (from Hep C vs autoimmune?).  Since then pt with sleep issues and depression. Local farmer raises cows and soybean   Review of Systems       per HPI   Physical Exam  General:  Well-developed,well-nourished,in no acute distress; alert,appropriate and cooperative throughout examination Mouth:  Oral mucosa and  oropharynx without lesions or exudates.  Teeth in good repair. Neck:  No deformities, masses, or tenderness noted.  no thyromegaly.  no bruits Lungs:  Normal respiratory effort, chest expands symmetrically. Lungs are clear to auscultation, no crackles or wheezes. Heart:  Normal rate and regular rhythm. S1 and S2 normal without gallop, murmur, click, rub or other extra sounds. Abdomen:  Bowel sounds positive,abdomen soft and non-tender without masses, organomegaly or hernias noted. Pulses:  2+ rad pulses Extremities:  no pedal edema Skin:  Intact without suspicious lesions or rashes   Impression & Recommendations:  Problem # 1:  HYPERTENSION (ICD-401.9) Assessment Improved good control, continue meds.  His updated medication list for this problem includes:    Hydrochlorothiazide 12.5 Mg Caps (Hydrochlorothiazide) .Marland Kitchen... Take one daily in am for blood pressure  BP today: 128/78 Prior BP: 136/82 (04/22/2010)  Labs Reviewed: K+: 3.8 (04/22/2010) Creat: : 1.0 (04/22/2010)   Chol: 187 (02/08/2010)   HDL: 50.40 (02/08/2010)   LDL: 109 (02/08/2010)   TG: 137.0 (02/08/2010)  Problem # 2:  DEPRESSION (ICD-311) Assessment: Improved stable off meds.  previously situational at wife's passing.  The following medications were removed from the medication list:    Citalopram Hydrobromide 20 Mg Tabs (Citalopram hydrobromide) .Marland Kitchen... 1/2 tablet by mouth every other day.  Problem # 3:  OBESITY (ICD-278.00) discussed less salt, more fruits/vegetables.  Ht: 69.0 (04/22/2010)   Wt: 234.25 (06/14/2010)   BMI: 34.90 (04/22/2010)  Complete Medication List: 1)  Hydrochlorothiazide 12.5 Mg Caps (  Hydrochlorothiazide) .... Take one daily in am for blood pressure 2)  Ibuprofen 200 Mg Tabs (Ibuprofen) .... Otc as directed.   Patient Instructions: 1)  return at your convenience for CPE. 2)  return for bp f/u in 6 months. 3)  Stay away from salt, try to increase exercise and more fruits and vegetables. 4)   Good ot see you today, call clinic with quesitons   Orders Added: 1)  Est. Patient Level III [91478]  Current Allergies (reviewed today): No known allergies   Prevention & Chronic Care Immunizations   Influenza vaccine: Not documented    Tetanus booster: Not documented    Pneumococcal vaccine: Not documented  Colorectal Screening   Hemoccult: Not documented   Hemoccult action/deferral: Not indicated  (12/13/2009)    Colonoscopy:  Results: Normal. Results: Internal Hemorrhoids.   (05/26/2009)   Colonoscopy action/deferral: Repeat colonoscopy in 10 years.    (05/26/2009)   Colonoscopy due: 06/2019  Other Screening   PSA: 0.47  (11/03/2008)   Smoking status: never  (12/13/2009)  Lipids   Total Cholesterol: 187  (02/08/2010)   LDL: 109  (02/08/2010)   LDL Direct: Not documented   HDL: 50.40  (02/08/2010)   Triglycerides: 137.0  (02/08/2010)  Hypertension   Last Blood Pressure: 128 / 78  (06/14/2010)   Serum creatinine: 1.0  (04/22/2010)   Serum potassium 3.8  (04/22/2010)  Self-Management Support :    Hypertension self-management support: Not documented

## 2010-08-01 ENCOUNTER — Encounter: Payer: Self-pay | Admitting: Family Medicine

## 2010-08-03 ENCOUNTER — Other Ambulatory Visit: Payer: Self-pay

## 2010-08-08 ENCOUNTER — Encounter: Payer: Self-pay | Admitting: Family Medicine

## 2010-09-01 ENCOUNTER — Telehealth: Payer: Self-pay | Admitting: *Deleted

## 2010-09-02 MED ORDER — ZOLPIDEM TARTRATE 10 MG PO TABS: ORAL_TABLET | ORAL | Status: DC

## 2010-09-02 NOTE — Telephone Encounter (Signed)
Will send in but as temporary measure.  If insomnia continues, will need to discuss.

## 2010-09-02 NOTE — Telephone Encounter (Signed)
Message left for patient to call back and advise pharmacy for call in.

## 2010-09-05 NOTE — Telephone Encounter (Signed)
Rx called in as directed.   

## 2010-09-05 NOTE — Telephone Encounter (Signed)
Pt called back, uses [facility] [address]

## 2010-10-24 ENCOUNTER — Other Ambulatory Visit: Payer: Self-pay | Admitting: *Deleted

## 2010-10-24 MED ORDER — HYDROCHLOROTHIAZIDE 12.5 MG PO CAPS: 12.5000 mg | ORAL_CAPSULE | Freq: Every day | ORAL | Status: DC

## 2010-12-05 ENCOUNTER — Encounter: Payer: Self-pay | Admitting: Family Medicine

## 2010-12-05 ENCOUNTER — Ambulatory Visit (INDEPENDENT_AMBULATORY_CARE_PROVIDER_SITE_OTHER): Payer: Self-pay | Admitting: Family Medicine

## 2010-12-05 DIAGNOSIS — R079 Chest pain, unspecified: Secondary | ICD-10-CM

## 2010-12-05 DIAGNOSIS — R0789 Other chest pain: Secondary | ICD-10-CM

## 2010-12-05 MED ORDER — ALPRAZOLAM 0.5 MG PO TABS: 0.2500 mg | ORAL_TABLET | Freq: Three times a day (TID) | ORAL | Status: DC | PRN

## 2010-12-05 NOTE — Progress Notes (Signed)
Chest pain.  5-15 minutes of SSCP, sometimes better with drinking fluids.  A few episodes over the weekend.  No sx before the weekends.  No exertional sx.  Had been working in the heat w/o chest pain.  Inc in social stress.  Trouble with girlfriend, was engaged but that was broken off . Now with roundup resistant weeds in crops.    No SI/HI.  He contracts for safety.   No FH CAD.    PMH and SH reviewed  ROS: See HPI, otherwise noncontributory.  Meds, vitals, and allergies reviewed.   GEN: nad, alert and oriented, obese HEENT: mucous membranes moist NECK: supple w/o LA CV: rrr.  Chest wall not ttp PULM: ctab, no inc wob ABD: soft, +bs EXT: no edema SKIN: no acute rash  CP-similar to recent sx- here in clinic, better with GI cocktail.  Rapid relief.   EKG reviewed.

## 2010-12-05 NOTE — Patient Instructions (Signed)
Schedule a visit with Dr. Reece Agar later this week.   Take the xanax as needed (sedation caution) and start on omeprazole 20mg  a day.  Take care.

## 2010-12-06 ENCOUNTER — Encounter: Payer: Self-pay | Admitting: Family Medicine

## 2010-12-06 NOTE — Assessment & Plan Note (Signed)
I talked to pt about this in the clinic.  He called back and we talked more about his social stress.  This is atypical for cardiac source.  No exertional sx, better with GI cocktail.  High social stress.  Use PPI and xanax for presumed heartburn and anxiety, contracts for safety and f/u with me later in the week.  He may need to restart celexa.  He'll bring his old rx to the the next OV.  He agrees with plan.  >25 min spent with face to face with patient, >50% counseling and/or coordinating care.

## 2010-12-08 ENCOUNTER — Encounter: Payer: Self-pay | Admitting: Family Medicine

## 2010-12-08 ENCOUNTER — Ambulatory Visit (INDEPENDENT_AMBULATORY_CARE_PROVIDER_SITE_OTHER): Payer: BC Managed Care – PPO | Admitting: Family Medicine

## 2010-12-08 ENCOUNTER — Ambulatory Visit: Payer: BC Managed Care – PPO | Admitting: Family Medicine

## 2010-12-08 DIAGNOSIS — R0789 Other chest pain: Secondary | ICD-10-CM

## 2010-12-08 NOTE — Assessment & Plan Note (Signed)
Much improved.  >25 min spent with face to face with patient, >50% counseling.  I think the SSRI would be useful in this case.  I told pt that I thought it was a good idea to take it- celexa 20mg  a day- and use the xanax prn.  He understood.  No Si/Hi. We talked about his recent relationship.  He is considering ways to put space between them.  They only live 1.5 miles apart, so this may be difficult.  He is okay for outpatient f/u.  Call back or f/u prn.

## 2010-12-08 NOTE — Progress Notes (Signed)
F/u of chest pain.  No CP since using episodic xanax for anxiety.  Sleeping well.  It appears, based on his conversation that a majority of his stress/anxiety is related to ending relationship with girlfriend.  We had a long talk about this today.  No CP, no sob.  No SI/HI.  He isn't on the celexa yet.  He still has a valid rx.  He hasn't needed the prilosec.   Meds, vitals, and allergies reviewed.   ROS: See HPI.  Otherwise, noncontributory.  nad Affect brighter today than at prev OV No tremor Speech, judgement intact

## 2011-02-02 ENCOUNTER — Other Ambulatory Visit: Payer: Self-pay | Admitting: *Deleted

## 2011-02-02 MED ORDER — ALPRAZOLAM 0.5 MG PO TABS: 0.2500 mg | ORAL_TABLET | Freq: Three times a day (TID) | ORAL | Status: DC | PRN

## 2011-02-02 NOTE — Telephone Encounter (Signed)
Ok.  plz phone in.

## 2011-02-02 NOTE — Telephone Encounter (Signed)
Rx called in as directed.   

## 2011-02-02 NOTE — Telephone Encounter (Signed)
Ok to refill 

## 2011-07-28 ENCOUNTER — Other Ambulatory Visit: Payer: Self-pay | Admitting: *Deleted

## 2011-07-28 MED ORDER — HYDROCHLOROTHIAZIDE 12.5 MG PO CAPS: 12.5000 mg | ORAL_CAPSULE | Freq: Every day | ORAL | Status: DC

## 2011-08-24 ENCOUNTER — Other Ambulatory Visit: Payer: Self-pay | Admitting: Orthopedic Surgery

## 2011-08-24 DIAGNOSIS — M25519 Pain in unspecified shoulder: Secondary | ICD-10-CM

## 2011-08-27 ENCOUNTER — Ambulatory Visit
Admission: RE | Admit: 2011-08-27 | Discharge: 2011-08-27 | Disposition: A | Discharge status: Home / Self Care | Payer: BC Managed Care – PPO | Source: Ambulatory Visit | Attending: Orthopedic Surgery | Admitting: Orthopedic Surgery

## 2011-08-27 DIAGNOSIS — M25519 Pain in unspecified shoulder: Secondary | ICD-10-CM

## 2012-01-11 ENCOUNTER — Telehealth: Payer: Self-pay | Admitting: Family Medicine

## 2012-01-11 NOTE — Telephone Encounter (Signed)
Caller: Kevaughn/Patient; Patient Name: Ryan Anderson; PCP: Crawford Givens Clelia Croft) [facility]); Best Callback Phone Number: [phone] Onset- 6 months. Pt c/o of increased fatigue for about six months. He has also gained weight over the last few months. He was given a Meloxicam rx by another physician and wonders if that was the cause of weight gain. He is no longer taking this medication. Pt reports 2 days ago he checked his blood sugar at a friends house and the reading was  24 but he is not sure if it was an accurate reading. He denies any hx of diabetes. Emergent s/s of Fatigue protocol r/o. Pt to see provider within 24hrs. No appointments for today, appointment scheduled for tomorrow at 10:30am with Dr. Para Anderson.  Pt states he sees Dr. Para Anderson but  [facility] has Dr. Sharen Hones as PCP.

## 2012-01-12 ENCOUNTER — Encounter: Payer: Self-pay | Admitting: Family Medicine

## 2012-01-12 ENCOUNTER — Ambulatory Visit (INDEPENDENT_AMBULATORY_CARE_PROVIDER_SITE_OTHER): Payer: BC Managed Care – PPO | Admitting: Family Medicine

## 2012-01-12 VITALS — BP 140/90 | HR 80 | Temp 98.1°F | Wt 270.0 lb

## 2012-01-12 DIAGNOSIS — R5381 Other malaise: Secondary | ICD-10-CM

## 2012-01-12 DIAGNOSIS — I10 Essential (primary) hypertension: Secondary | ICD-10-CM

## 2012-01-12 DIAGNOSIS — R5383 Other fatigue: Secondary | ICD-10-CM

## 2012-01-12 DIAGNOSIS — E669 Obesity, unspecified: Secondary | ICD-10-CM

## 2012-01-12 LAB — POCT CBG (FASTING - GLUCOSE)-MANUAL ENTRY: Glucose Fasting, POC: 128 mg/dL — AB (ref 70–99)

## 2012-01-12 LAB — COMPREHENSIVE METABOLIC PANEL
ALT: 24 U/L (ref 0–53)
AST: 26 U/L (ref 0–37)
Albumin: 4.1 g/dL (ref 3.5–5.2)
Alkaline Phosphatase: 73 U/L (ref 39–117)
BUN: 17 mg/dL (ref 6–23)
CO2: 24 mEq/L (ref 19–32)
Calcium: 9.1 mg/dL (ref 8.4–10.5)
Chloride: 107 mEq/L (ref 96–112)
GFR: 73.09 mL/min (ref >60.00)
Glucose, Bld: 110 mg/dL — ABNORMAL HIGH (ref 70–99)
Potassium: 4.1 mEq/L (ref 3.5–5.1)
Sodium: 141 mEq/L (ref 135–145)
Total Bilirubin: 0.7 mg/dL (ref 0.3–1.2)
Total Protein: 7.2 g/dL (ref 6.0–8.3)

## 2012-01-12 LAB — TSH: TSH: 0.56 u[IU]/mL (ref 0.35–5.50)

## 2012-01-12 NOTE — Patient Instructions (Addendum)
Go to the lab on the way out.  We'll contact you with your lab report.  

## 2012-01-12 NOTE — Progress Notes (Signed)
Weight gain and fatigue.  Had been on meloxicam for his shoulder last December.  He didn't know if this was related.  Weight has increased more since coming off the med. No meds now.  No FCNAV.  No CP.  No BLE edema.  Diet and activity level is at baseline, no sig change.  Vodka on the weekends, usually 1-2 drinks in a weekend.    Mood is improved since he has broken off relationship with his ex girlfriend.    Meds, vitals, and allergies reviewed.   ROS: See HPI.  Otherwise, noncontributory.  GEN: nad, alert and oriented, obese HEENT: mucous membranes moist NECK: supple w/o LA CV: rrr.  PULM: ctab, no inc wob ABD: soft, +bs EXT: trace edema SKIN: no acute rash but does have red lesions consistent with chigger bites on BLE (known exposure per patient)

## 2012-01-14 NOTE — Assessment & Plan Note (Addendum)
Likely related to obesity.  Needs to lose weight with diet and exercise.  See notes on labs.  No meds at this point.

## 2012-01-14 NOTE — Assessment & Plan Note (Signed)
Likely related to caloric excess.  Needs to lose weight with diet and exercise.  See notes on labs.

## 2012-01-15 ENCOUNTER — Encounter: Payer: Self-pay | Admitting: *Deleted

## 2012-01-30 ENCOUNTER — Ambulatory Visit (INDEPENDENT_AMBULATORY_CARE_PROVIDER_SITE_OTHER): Payer: BC Managed Care – PPO | Admitting: Family Medicine

## 2012-01-30 ENCOUNTER — Encounter: Payer: Self-pay | Admitting: Family Medicine

## 2012-01-30 VITALS — BP 150/90 | HR 66 | Temp 98.0°F | Wt 274.0 lb

## 2012-01-30 DIAGNOSIS — E669 Obesity, unspecified: Secondary | ICD-10-CM

## 2012-01-30 NOTE — Patient Instructions (Addendum)
Use google.com and type in "calories in" along with the food you are eating.  Total your calories in a day, then cut the total by 5% (about 100 cal). Look up heart.org and try to get 3 meals a day.  Let me know if you want to go to nutrition.   Take care.

## 2012-01-30 NOTE — Progress Notes (Signed)
Weight is up, TSH wnl.  Here to discuss.  He's tried skipping meals.  He has a "big appetite".  He gained the weight in adulthood.  He weighted 165 at the end of high school.  We talked about options re: diet and weight.  He hasn't totalled his calories per day.    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  He had improved last year w/o intervention.  His sx are stable now, with limited extension of B 4th/5th digits.  He wants to leave this as is.    Meds, vitals, and allergies reviewed.   ROS: See HPI.  Otherwise, noncontributory.  nad obese

## 2012-01-31 NOTE — Assessment & Plan Note (Signed)
D/w pt.  Needs to total calories, then cut by 5% initially.  Will need more exercise.  Advice given (can google calorie counts, heart.org for diet tips) and understood.  Needs to lose weight for BP, CV risk benefit.  Understood.  Declined nutrition eval at this point. Labs d/w pt. >15 min spent with face to face with patient, >50% counseling and/or coordinating care

## 2014-01-13 ENCOUNTER — Encounter: Payer: Self-pay | Admitting: Internal Medicine

## 2014-01-13 ENCOUNTER — Ambulatory Visit (INDEPENDENT_AMBULATORY_CARE_PROVIDER_SITE_OTHER): Payer: BC Managed Care – PPO | Admitting: Internal Medicine

## 2014-01-13 VITALS — BP 148/92 | HR 76 | Temp 98.0°F | Wt 293.0 lb

## 2014-01-13 DIAGNOSIS — Z23 Encounter for immunization: Secondary | ICD-10-CM

## 2014-01-13 DIAGNOSIS — L02419 Cutaneous abscess of limb, unspecified: Secondary | ICD-10-CM

## 2014-01-13 DIAGNOSIS — S81852A Open bite, left lower leg, initial encounter: Secondary | ICD-10-CM

## 2014-01-13 DIAGNOSIS — W540XXA Bitten by dog, initial encounter: Secondary | ICD-10-CM

## 2014-01-13 DIAGNOSIS — S81809A Unspecified open wound, unspecified lower leg, initial encounter: Secondary | ICD-10-CM

## 2014-01-13 DIAGNOSIS — L03115 Cellulitis of right lower limb: Secondary | ICD-10-CM

## 2014-01-13 DIAGNOSIS — L03119 Cellulitis of unspecified part of limb: Secondary | ICD-10-CM

## 2014-01-13 DIAGNOSIS — S91009A Unspecified open wound, unspecified ankle, initial encounter: Secondary | ICD-10-CM

## 2014-01-13 DIAGNOSIS — S81009A Unspecified open wound, unspecified knee, initial encounter: Secondary | ICD-10-CM

## 2014-01-13 MED ORDER — AMOXICILLIN-POT CLAVULANATE 875-125 MG PO TABS: 1.0000 | ORAL_TABLET | Freq: Two times a day (BID) | ORAL | Status: DC

## 2014-01-13 NOTE — Patient Instructions (Signed)

## 2014-01-13 NOTE — Progress Notes (Signed)
Subjective:    Patient ID: Ryan Anderson, male    DOB: 03-12-1956, 58 y.o.   MRN: [id number]  HPI  Pt presents to the clinic today with c/o a dog bite to his leg. This occurred 1 week ago. He was bit by his pitbull. The dog is up to date on his shots. The wound has not gotten worse over the last week. It  has drained some fluid with a bad odor. It is red and tender. He denies pain, fever, chills. He has been washing the wound with peroxide and warm water and soap.  Review of Systems  Past Medical History  Diagnosis Date  . Other chest pain   . Routine general medical examination at a health care facility   . Unspecified essential hypertension   . Insomnia, unspecified   . Disturbance of skin sensation   . Obesity, unspecified   . Screening examination for venereal disease   . Hypogonadism male     BORDERLINE  . History of nephrolithiasis   . Brachial neuritis     Right-RESOLVED-(Weymann)  . Depressive disorder, not elsewhere classified     prev treated with celexa after wife died 2008/09/07    No current outpatient prescriptions on file.   No current facility-administered medications for this visit.    No Known Allergies  Family History  Problem Relation Age of Onset  . Alcohol abuse Father   . Parkinsonism Father   . Hypertension Father     ?     History   Social History  . Marital Status: Widowed    Spouse Name: N/A    Number of Children: N/A  . Years of Education: N/A   Occupational History  . Local farmer (cows and soybeans)    Social History Main Topics  . Smoking status: Never Smoker   . Smokeless tobacco: Not on file  . Alcohol Use: Yes     Comment: Occasional  . Drug Use: No  . Sexual Activity: Not on file   Other Topics Concern  . Not on file   Social History Narrative   Ryan Anderson   Wife died 09-07-2008     Constitutional: Denies fever, malaise, fatigue, headache or abrupt weight changes.  Respiratory: Denies difficulty breathing, shortness of  breath, cough or sputum production.   Cardiovascular: Denies chest pain, chest tightness, palpitations or swelling in the hands or feet.  Skin: Pt reports dog bite. Denies rashes, lesions or ulcercations.    No other specific complaints in a complete review of systems (except as listed in HPI above).     Objective:   Physical Exam   BP 148/92  Pulse 76  Temp(Src) 98 F (36.7 C) (Oral)  Wt 293 lb (132.904 kg)  SpO2 98% Wt Readings from Last 3 Encounters:  01/13/14 293 lb (132.904 kg)  01/30/12 274 lb (124.286 kg)  01/12/12 270 lb (122.471 kg)    General: Appears his stated age, obese butwell developed, well nourished in NAD. Skin:3 puncture wounds noted on right anterior lower leg, scabbed but draining serosanguinous fluid. Large oval area of cellulitis noted around the wound. Cardiovascular: Normal rate and rhythm. S1,S2 noted.  No murmur, rubs or gallops noted. 2+ pitting edema bilaterally. Pulmonary/Chest: Normal effort and positive vesicular breath sounds. No respiratory distress. No wheezes, rales or ronchi noted.    BMET    Component Value Date/Time   NA 141 01/12/2012 1115   K 4.1 01/12/2012 1115   CL 107 01/12/2012 1115  CO2 24 01/12/2012 1115   GLUCOSE 110* 01/12/2012 1115   BUN 17 01/12/2012 1115   CREATININE 1.1 01/12/2012 1115   CALCIUM 9.1 01/12/2012 1115   GFRNONAA 78.45 04/22/2010 1247    Lipid Panel     Component Value Date/Time   CHOL 187 02/08/2010 0844   TRIG 137.0 02/08/2010 0844   HDL 50.40 02/08/2010 0844   CHOLHDL 4 02/08/2010 0844   VLDL 27.4 02/08/2010 0844   LDLCALC 109* 02/08/2010 0844    CBC    Component Value Date/Time   WBC 8.2 09/21/2009   RBC 4.82 09/21/2009   HGB 15.0 09/21/2009   HCT 43.8 09/21/2009   PLT 269 09/21/2009    Hgb A1C No results found for this basename: HGBA1C        Assessment & Plan:  Dog bite of RLE with cellulitis:  I did have Dr. Para March to come give me a second opinion He is okay with starting augmentin BID x 10  days Area of cellulitis marked Advised pt if redness expands beyond the marker, to go to ER He will update [place] later this week to let us know if improving/not improving Keep leg elevated Tdap today  RTC as needed or if symptoms persist or worsen

## 2014-01-13 NOTE — Progress Notes (Signed)
Pre visit review using our clinic review tool, if applicable. No additional management support is needed unless otherwise documented below in the visit note. 

## 2014-01-14 ENCOUNTER — Telehealth: Payer: Self-pay

## 2014-01-14 NOTE — Telephone Encounter (Signed)
It sounds like the infection is some better, would continue with the abx.  If redness continues to get better, then continue as is.  If worsening, to ER.  He has naproxen.  Can take that for knee pain.  If not improving, he'll need his knee checked.

## 2014-01-14 NOTE — Telephone Encounter (Signed)
Reviewed instructions with pt.  Pt verbalized understanding.

## 2014-01-14 NOTE — Telephone Encounter (Signed)
Pt was seen 01/13/14 with dog bite; area of redness has not gone outside of marking done at visit; does not appear to be as red as on 01/13/14 but feels warm to touch, does not appear to be draining as bad and pt is taking antibiotic, no fever. Pt does clean area of dog bite when it begins to smell. Pt has cleansed area x 2 today. Pt was trying to get something out of med cabinet which is over commode. Pt stepped up on commode and pt did not fall but felt something pop in lt knee; pt said has to be careful or knee feels like will give way. Pt has had lt knee pain for one month but now is worse. Pt does not want to come in for appt. Pt request cb.

## 2016-05-29 ENCOUNTER — Inpatient Hospital Stay (HOSPITAL_COMMUNITY)
Admission: EM | Admit: 2016-05-29 | Discharge: 2016-06-08 | DRG: 061 | Disposition: E | Payer: Self-pay | Attending: Neurology | Admitting: Neurology

## 2016-05-29 ENCOUNTER — Emergency Department (HOSPITAL_COMMUNITY): Payer: Self-pay

## 2016-05-29 ENCOUNTER — Encounter (HOSPITAL_COMMUNITY): Payer: Self-pay | Admitting: Emergency Medicine

## 2016-05-29 DIAGNOSIS — R471 Dysarthria and anarthria: Secondary | ICD-10-CM | POA: Diagnosis present

## 2016-05-29 DIAGNOSIS — E1165 Type 2 diabetes mellitus with hyperglycemia: Secondary | ICD-10-CM | POA: Diagnosis present

## 2016-05-29 DIAGNOSIS — R29810 Facial weakness: Secondary | ICD-10-CM | POA: Diagnosis present

## 2016-05-29 DIAGNOSIS — Z6841 Body Mass Index (BMI) 40.0 and over, adult: Secondary | ICD-10-CM

## 2016-05-29 DIAGNOSIS — R569 Unspecified convulsions: Secondary | ICD-10-CM

## 2016-05-29 DIAGNOSIS — E785 Hyperlipidemia, unspecified: Secondary | ICD-10-CM | POA: Diagnosis present

## 2016-05-29 DIAGNOSIS — I5031 Acute diastolic (congestive) heart failure: Secondary | ICD-10-CM

## 2016-05-29 DIAGNOSIS — Z9911 Dependence on respirator [ventilator] status: Secondary | ICD-10-CM

## 2016-05-29 DIAGNOSIS — J9600 Acute respiratory failure, unspecified whether with hypoxia or hypercapnia: Secondary | ICD-10-CM

## 2016-05-29 DIAGNOSIS — R9401 Abnormal electroencephalogram [EEG]: Secondary | ICD-10-CM

## 2016-05-29 DIAGNOSIS — R531 Weakness: Secondary | ICD-10-CM

## 2016-05-29 DIAGNOSIS — E870 Hyperosmolality and hypernatremia: Secondary | ICD-10-CM | POA: Diagnosis present

## 2016-05-29 DIAGNOSIS — N179 Acute kidney failure, unspecified: Secondary | ICD-10-CM

## 2016-05-29 DIAGNOSIS — R4020 Unspecified coma: Secondary | ICD-10-CM | POA: Diagnosis not present

## 2016-05-29 DIAGNOSIS — F329 Major depressive disorder, single episode, unspecified: Secondary | ICD-10-CM | POA: Diagnosis present

## 2016-05-29 DIAGNOSIS — E876 Hypokalemia: Secondary | ICD-10-CM | POA: Diagnosis present

## 2016-05-29 DIAGNOSIS — J9601 Acute respiratory failure with hypoxia: Secondary | ICD-10-CM

## 2016-05-29 DIAGNOSIS — I63412 Cerebral infarction due to embolism of left middle cerebral artery: Secondary | ICD-10-CM

## 2016-05-29 DIAGNOSIS — G936 Cerebral edema: Secondary | ICD-10-CM | POA: Diagnosis present

## 2016-05-29 DIAGNOSIS — E7800 Pure hypercholesterolemia, unspecified: Secondary | ICD-10-CM | POA: Diagnosis present

## 2016-05-29 DIAGNOSIS — Z0189 Encounter for other specified special examinations: Secondary | ICD-10-CM

## 2016-05-29 DIAGNOSIS — I4891 Unspecified atrial fibrillation: Secondary | ICD-10-CM | POA: Diagnosis present

## 2016-05-29 DIAGNOSIS — J690 Pneumonitis due to inhalation of food and vomit: Secondary | ICD-10-CM | POA: Diagnosis not present

## 2016-05-29 DIAGNOSIS — R0603 Acute respiratory distress: Secondary | ICD-10-CM

## 2016-05-29 DIAGNOSIS — Z4659 Encounter for fitting and adjustment of other gastrointestinal appliance and device: Secondary | ICD-10-CM

## 2016-05-29 DIAGNOSIS — Z515 Encounter for palliative care: Secondary | ICD-10-CM | POA: Diagnosis not present

## 2016-05-29 DIAGNOSIS — Z8673 Personal history of transient ischemic attack (TIA), and cerebral infarction without residual deficits: Secondary | ICD-10-CM

## 2016-05-29 DIAGNOSIS — R4701 Aphasia: Secondary | ICD-10-CM

## 2016-05-29 DIAGNOSIS — J9690 Respiratory failure, unspecified, unspecified whether with hypoxia or hypercapnia: Secondary | ICD-10-CM

## 2016-05-29 DIAGNOSIS — Z66 Do not resuscitate: Secondary | ICD-10-CM | POA: Diagnosis not present

## 2016-05-29 DIAGNOSIS — I160 Hypertensive urgency: Secondary | ICD-10-CM | POA: Diagnosis present

## 2016-05-29 DIAGNOSIS — D72829 Elevated white blood cell count, unspecified: Secondary | ICD-10-CM

## 2016-05-29 DIAGNOSIS — Z820 Family history of epilepsy and other diseases of the nervous system: Secondary | ICD-10-CM

## 2016-05-29 DIAGNOSIS — I10 Essential (primary) hypertension: Secondary | ICD-10-CM | POA: Diagnosis present

## 2016-05-29 DIAGNOSIS — G8101 Flaccid hemiplegia affecting right dominant side: Secondary | ICD-10-CM

## 2016-05-29 DIAGNOSIS — Z8249 Family history of ischemic heart disease and other diseases of the circulatory system: Secondary | ICD-10-CM

## 2016-05-29 DIAGNOSIS — R402214 Coma scale, best verbal response, none, 24 hours or more after hospital admission: Secondary | ICD-10-CM | POA: Diagnosis not present

## 2016-05-29 DIAGNOSIS — R402344 Coma scale, best motor response, flexion withdrawal, 24 hours or more after hospital admission: Secondary | ICD-10-CM | POA: Diagnosis not present

## 2016-05-29 DIAGNOSIS — I639 Cerebral infarction, unspecified: Secondary | ICD-10-CM

## 2016-05-29 DIAGNOSIS — I63432 Cerebral infarction due to embolism of left posterior cerebral artery: Principal | ICD-10-CM | POA: Diagnosis present

## 2016-05-29 DIAGNOSIS — R402114 Coma scale, eyes open, never, 24 hours or more after hospital admission: Secondary | ICD-10-CM | POA: Diagnosis not present

## 2016-05-29 DIAGNOSIS — I110 Hypertensive heart disease with heart failure: Secondary | ICD-10-CM | POA: Diagnosis present

## 2016-05-29 LAB — DIFFERENTIAL
BASOS PCT: 0 %
Basophils Absolute: 0 10*3/uL (ref 0.0–0.1)
EOS ABS: 0.3 10*3/uL (ref 0.0–0.7)
Eosinophils Relative: 3 %
Lymphocytes Relative: 20 %
Lymphs Abs: 2.3 10*3/uL (ref 0.7–4.0)
MONO ABS: 0.8 10*3/uL (ref 0.1–1.0)
MONOS PCT: 7 %
NEUTROS ABS: 8.1 10*3/uL — AB (ref 1.7–7.7)
Neutrophils Relative %: 70 %

## 2016-05-29 LAB — URINALYSIS, ROUTINE W REFLEX MICROSCOPIC
BACTERIA UA: NONE SEEN
Bilirubin Urine: NEGATIVE
Ketones, ur: NEGATIVE mg/dL
LEUKOCYTES UA: NEGATIVE
Nitrite: NEGATIVE
PH: 7 (ref 5.0–8.0)
Protein, ur: 100 mg/dL — AB
SPECIFIC GRAVITY, URINE: 1.011 (ref 1.005–1.030)
SQUAMOUS EPITHELIAL / LPF: NONE SEEN

## 2016-05-29 LAB — CBC
HEMATOCRIT: 47.6 % (ref 39.0–52.0)
HEMOGLOBIN: 16.9 g/dL (ref 13.0–17.0)
MCH: 31.9 pg (ref 26.0–34.0)
MCHC: 35.5 g/dL (ref 30.0–36.0)
MCV: 90 fL (ref 78.0–100.0)
Platelets: 225 10*3/uL (ref 150–400)
RBC: 5.29 MIL/uL (ref 4.22–5.81)
RDW: 12.7 % (ref 11.5–15.5)
WBC: 11.4 10*3/uL — ABNORMAL HIGH (ref 4.0–10.5)

## 2016-05-29 LAB — COMPREHENSIVE METABOLIC PANEL
ALT: 31 U/L (ref 17–63)
AST: 37 U/L (ref 15–41)
Albumin: 3.7 g/dL (ref 3.5–5.0)
Alkaline Phosphatase: 103 U/L (ref 38–126)
Anion gap: 12 (ref 5–15)
BILIRUBIN TOTAL: 0.7 mg/dL (ref 0.3–1.2)
BUN: 17 mg/dL (ref 6–20)
CHLORIDE: 101 mmol/L (ref 101–111)
CO2: 25 mmol/L (ref 22–32)
CREATININE: 1.26 mg/dL — AB (ref 0.61–1.24)
Calcium: 9.4 mg/dL (ref 8.9–10.3)
GFR calc Af Amer: >60 mL/min (ref >60)
GFR, EST NON AFRICAN AMERICAN: 59 mL/min — AB (ref >60)
Glucose, Bld: 235 mg/dL — ABNORMAL HIGH (ref 65–99)
Potassium: 3.7 mmol/L (ref 3.5–5.1)
Sodium: 138 mmol/L (ref 135–145)
TOTAL PROTEIN: 7.3 g/dL (ref 6.5–8.1)

## 2016-05-29 LAB — I-STAT CHEM 8, ED
BUN: 21 mg/dL — ABNORMAL HIGH (ref 6–20)
Calcium, Ion: 1.13 mmol/L — ABNORMAL LOW (ref 1.15–1.40)
Chloride: 100 mmol/L — ABNORMAL LOW (ref 101–111)
Creatinine, Ser: 1.2 mg/dL (ref 0.61–1.24)
Glucose, Bld: 237 mg/dL — ABNORMAL HIGH (ref 65–99)
HCT: 50 % (ref 39.0–52.0)
Hemoglobin: 17 g/dL (ref 13.0–17.0)
Potassium: 3.5 mmol/L (ref 3.5–5.1)
Sodium: 140 mmol/L (ref 135–145)
TCO2: 30 mmol/L (ref 0–100)

## 2016-05-29 LAB — CBG MONITORING, ED: Glucose-Capillary: 221 mg/dL — ABNORMAL HIGH (ref 65–99)

## 2016-05-29 LAB — PROTIME-INR
INR: 0.95
Prothrombin Time: 12.7 seconds (ref 11.4–15.2)

## 2016-05-29 LAB — I-STAT TROPONIN, ED: TROPONIN I, POC: 0.03 ng/mL (ref 0.00–0.08)

## 2016-05-29 LAB — ETHANOL: Alcohol, Ethyl (B): <5 mg/dL (ref <5)

## 2016-05-29 LAB — APTT: aPTT: 30 seconds (ref 24–36)

## 2016-05-29 MED ORDER — NICARDIPINE HCL IN NACL 20-0.86 MG/200ML-% IV SOLN
3.0000 mg/h | INTRAVENOUS | Status: DC
  Administered 2016-05-29 – 2016-05-31 (×4): 5 mg/h via INTRAVENOUS
  Administered 2016-05-31: 2.5 mg/h via INTRAVENOUS
  Administered 2016-06-03: 9 mg/h via INTRAVENOUS
  Administered 2016-06-03: 5 mg/h via INTRAVENOUS
  Filled 2016-05-29: qty 400
  Filled 2016-05-29 (×2): qty 200
  Filled 2016-05-29: qty 400

## 2016-05-29 MED ORDER — LABETALOL HCL 5 MG/ML IV SOLN
10.0000 mg | Freq: Once | INTRAVENOUS | Status: AC
  Administered 2016-05-29: 10 mg via INTRAVENOUS

## 2016-05-29 MED ORDER — ALTEPLASE (STROKE) FULL DOSE INFUSION
90.0000 mg | Freq: Once | INTRAVENOUS | Status: AC
  Administered 2016-05-29: 90 mg via INTRAVENOUS
  Filled 2016-05-29: qty 100

## 2016-05-29 MED ORDER — LABETALOL HCL 5 MG/ML IV SOLN
20.0000 mg | Freq: Once | INTRAVENOUS | Status: AC
  Administered 2016-05-29: 20 mg via INTRAVENOUS

## 2016-05-29 MED ORDER — IOPAMIDOL (ISOVUE-370) INJECTION 76%
INTRAVENOUS | Status: AC
  Administered 2016-05-29: 50 mL via INTRAVENOUS
  Filled 2016-05-29: qty 50

## 2016-05-29 MED ORDER — SODIUM CHLORIDE 0.9 % IV SOLN
50.0000 mL | Freq: Once | INTRAVENOUS | Status: AC
  Administered 2016-05-30: 50 mL via INTRAVENOUS

## 2016-05-29 NOTE — ED Triage Notes (Signed)
BIB EMS from home, LKW 2145. Pt son called EMS for R sided paralysis, facial droop, slurred speech, vision changes. Pt in afib RVR en route, no hx afib. Pt hypertensive 210's/130's. Pt A/OX4. Pt to CT with this RN

## 2016-05-29 NOTE — ED Notes (Signed)
Pt back in room with Roseanne RenoStewart MD, pharmacy

## 2016-05-29 NOTE — ED Provider Notes (Signed)
[facility]-EMERGENCY DEPT Provider Note   CSN: [id number] Arrival date & time: June 24, 2016  10-10-2302     History   Chief Complaint No chief complaint on file.   HPI Ryan Anderson is a 61 y.o. male.  HPI  11:06 PM Initial medical screening exam performed upon patient arrival. ABCs intact. Patient cleared for CT scan. Last seen normal at 10:45 PM. Right-sided deficits with right facial droop right upper and lower extremity weakness.  This is a 61 year old male with no reported past medical history who presents with strokelike symptoms. Onset 10:45 PM. Right-sided facial droop as well as right sided weakness. Denies history of diabetes or hypertension. Denies history of atrial fibrillation. Is not on any anticoagulants.  Level V caveat for acuity of condition  Past Medical History:  Diagnosis Date  . Brachial neuritis    Right-RESOLVED-(Weymann)  . Depressive disorder, not elsewhere classified    prev treated with celexa after wife died 10/09/08  . Disturbance of skin sensation   . History of nephrolithiasis   . Hypogonadism male    BORDERLINE  . Insomnia, unspecified   . Obesity, unspecified   . Other chest pain   . Routine general medical examination at a health care facility   . Screening examination for venereal disease   . Unspecified essential hypertension     Patient Active Problem List   Diagnosis Date Noted  . OBESITY 02/17/2010  . CHEST PAIN, ATYPICAL 02/17/2010  . DEPRESSION 12/13/2009  . HYPERTENSION 12/13/2009  . INSOMNIA 12/13/2009  . NUMBNESS, HAND 12/13/2009    Past Surgical History:  Procedure Laterality Date  . TONSILLECTOMY AND ADENOIDECTOMY  1962       Home Medications    Prior to Admission medications   Medication Sig Start Date End Date Taking? Authorizing Provider  amoxicillin-clavulanate (AUGMENTIN) 875-125 MG per tablet Take 1 tablet by mouth 2 (two) times daily. 01/13/14   Lorre Munroe, NP  naproxen sodium (ANAPROX) 220 MG tablet Take 220 mg by  mouth as needed.    Historical Provider, MD    Family History Family History  Problem Relation Age of Onset  . Alcohol abuse Father   . Parkinsonism Father   . Hypertension Father     ?     Social History Social History  Substance Use Topics  . Smoking status: Never Smoker  . Smokeless tobacco: Not on file  . Alcohol use Yes     Comment: Occasional     Allergies   Patient has no known allergies.   Review of Systems Review of Systems  Unable to perform ROS: Acuity of condition     Physical Exam Updated Vital Signs BP (!) 203/128 (BP Location: Left Arm)   Pulse (!) 136 Comment: per EMS afib with RVR  Resp 22   SpO2 99%   Physical Exam  Constitutional: He is oriented to person, place, and time.  Obese, ABC's intact  HENT:  Head: Normocephalic and atraumatic.  Eyes: Pupils are equal, round, and reactive to light.  Cardiovascular: Normal rate and regular rhythm.   Pulmonary/Chest: Effort normal. No respiratory distress.  Abdominal: Soft. There is no tenderness.  Musculoskeletal: He exhibits no edema.  Neurological: He is alert and oriented to person, place, and time.  Right-sided facial droop noted, weakness noted right upper and lower extremity, patient does have some intact grip on the right, 5 out of 5 strength on the left  Skin: Skin is warm and dry.  Psychiatric: He has a  normal mood and affect.  Nursing note and vitals reviewed.    ED Treatments / Results  Labs (all labs ordered are listed, but only abnormal results are displayed) Labs Reviewed  CBC - Abnormal; Notable for the following:       Result Value   WBC 11.4 (*)    All other components within normal limits  DIFFERENTIAL - Abnormal; Notable for the following:    Neutro Abs 8.1 (*)    All other components within normal limits  I-STAT CHEM 8, ED - Abnormal; Notable for the following:    Chloride 100 (*)    BUN 21 (*)    Glucose, Bld 237 (*)    Calcium, Ion 1.13 (*)    All other  components within normal limits  CBG MONITORING, ED - Abnormal; Notable for the following:    Glucose-Capillary 221 (*)    All other components within normal limits  ETHANOL  PROTIME-INR  APTT  COMPREHENSIVE METABOLIC PANEL  RAPID URINE DRUG SCREEN, HOSP PERFORMED  URINALYSIS, ROUTINE W REFLEX MICROSCOPIC  I-STAT TROPOININ, ED    EKG  EKG Interpretation  Date/Time:  Monday May 29 2016 23:28:50 EST Ventricular Rate:  111 PR Interval:    QRS Duration: 88 QT Interval:  400 QTC Calculation: 544 R Axis:   68 Text Interpretation:  Atrial fibrillation with rapid ventricular response Nonspecific ST abnormality , probably digitalis effect Abnormal QRS-T angle, consider primary T wave abnormality Abnormal ECG No prior for comparison Confirmed by HORTON  MD, Toni AmendOURTNEY ([id number]) on 05/28/2016 11:36:20 PM       Radiology No results found.  Procedures Procedures (including critical care time)  CRITICAL CARE Performed by: Shon BatonHORTON, COURTNEY F   Total critical care time: 25 minutes  Critical care time was exclusive of separately billable procedures and treating other patients.  Critical care was necessary to treat or prevent imminent or life-threatening deterioration.  Critical care was time spent personally by me on the following activities: development of treatment plan with patient and/or surrogate as well as nursing, discussions with consultants, evaluation of patient's response to treatment, examination of patient, obtaining history from patient or surrogate, ordering and performing treatments and interventions, ordering and review of laboratory studies, ordering and review of radiographic studies, pulse oximetry and re-evaluation of patient's condition.   Medications Ordered in ED Medications  alteplase (ACTIVASE) 1 mg/mL infusion 90 mg (not administered)    Followed by  0.9 %  sodium chloride infusion (not administered)  iopamidol (ISOVUE-370) 76 % injection (not  administered)  labetalol (NORMODYNE,TRANDATE) injection 20 mg (not administered)  labetalol (NORMODYNE,TRANDATE) injection 10 mg (not administered)     Initial Impression / Assessment and Plan / ED Course  I have reviewed the triage vital signs and the nursing notes.  Pertinent labs & imaging results that were available during my care of the patient were reviewed by me and considered in my medical decision making (see chart for details).     Patient presents with strokelike symptoms. Within the window for TPA. Neurology is at the bedside. History of physical exam limited secondary to acuity of condition; however, he does appear to have some dense deficits on the right. Appears to be a new onset atrial fibrillation. Suspect embolic phenomenon. Patient given blood pressure medications to decrease blood pressure in order to give TPA. Anticipate admission to the ICU.  Final Clinical Impressions(s) / ED Diagnoses   Final diagnoses:  Cerebrovascular accident (CVA) due to embolism of left middle cerebral artery (HCC)  New Prescriptions New Prescriptions   No medications on file     Shon Baton, MD 06/08/16 2341

## 2016-05-30 ENCOUNTER — Inpatient Hospital Stay (HOSPITAL_COMMUNITY): Payer: Self-pay

## 2016-05-30 DIAGNOSIS — R739 Hyperglycemia, unspecified: Secondary | ICD-10-CM

## 2016-05-30 DIAGNOSIS — I160 Hypertensive urgency: Secondary | ICD-10-CM

## 2016-05-30 DIAGNOSIS — I6789 Other cerebrovascular disease: Secondary | ICD-10-CM

## 2016-05-30 DIAGNOSIS — I639 Cerebral infarction, unspecified: Secondary | ICD-10-CM | POA: Diagnosis present

## 2016-05-30 DIAGNOSIS — I63332 Cerebral infarction due to thrombosis of left posterior cerebral artery: Secondary | ICD-10-CM

## 2016-05-30 LAB — GLUCOSE, CAPILLARY
Glucose-Capillary: 311 mg/dL — ABNORMAL HIGH (ref 65–99)
Glucose-Capillary: 303 mg/dL — ABNORMAL HIGH (ref 65–99)
Glucose-Capillary: 196 mg/dL — ABNORMAL HIGH (ref 65–99)
Glucose-Capillary: 150 mg/dL — ABNORMAL HIGH (ref 65–99)
Glucose-Capillary: 172 mg/dL — ABNORMAL HIGH (ref 65–99)

## 2016-05-30 LAB — LIPID PANEL
CHOLESTEROL: 159 mg/dL (ref 0–200)
HDL: 41 mg/dL (ref >40)
LDL Cholesterol: 106 mg/dL — ABNORMAL HIGH (ref 0–99)
Total CHOL/HDL Ratio: 3.9 RATIO
Triglycerides: 58 mg/dL (ref <150)
VLDL: 12 mg/dL (ref 0–40)

## 2016-05-30 LAB — RAPID URINE DRUG SCREEN, HOSP PERFORMED
AMPHETAMINES: NOT DETECTED
Barbiturates: NOT DETECTED
Benzodiazepines: NOT DETECTED
Cocaine: NOT DETECTED
Opiates: NOT DETECTED
Tetrahydrocannabinol: NOT DETECTED

## 2016-05-30 LAB — MRSA PCR SCREENING: MRSA by PCR: NEGATIVE

## 2016-05-30 LAB — ECHOCARDIOGRAM COMPLETE
HEIGHTINCHES: 68.898 in
Weight: 4465.64 oz

## 2016-05-30 MED ORDER — ACETAMINOPHEN 325 MG PO TABS: 650.0000 mg | ORAL_TABLET | ORAL | Status: DC | PRN

## 2016-05-30 MED ORDER — INSULIN ASPART 100 UNIT/ML ~~LOC~~ SOLN
0.0000 [IU] | SUBCUTANEOUS | Status: DC
  Administered 2016-05-30: 2 [IU] via SUBCUTANEOUS
  Administered 2016-05-30: 7 [IU] via SUBCUTANEOUS
  Administered 2016-05-30: 2 [IU] via SUBCUTANEOUS
  Administered 2016-05-30: 7 [IU] via SUBCUTANEOUS
  Administered 2016-05-30: 1 [IU] via SUBCUTANEOUS
  Administered 2016-05-31 (×2): 2 [IU] via SUBCUTANEOUS
  Administered 2016-05-31: 3 [IU] via SUBCUTANEOUS

## 2016-05-30 MED ORDER — PERFLUTREN LIPID MICROSPHERE
INTRAVENOUS | Status: AC
  Administered 2016-05-30: 2 mL via INTRAVENOUS
  Filled 2016-05-30: qty 10

## 2016-05-30 MED ORDER — ACETAMINOPHEN 160 MG/5ML PO SOLN
650.0000 mg | ORAL | Status: DC | PRN
  Administered 2016-06-03 – 2016-06-05 (×7): 650 mg
  Filled 2016-05-30 (×7): qty 20.3

## 2016-05-30 MED ORDER — METOPROLOL TARTRATE 5 MG/5ML IV SOLN
5.0000 mg | Freq: Four times a day (QID) | INTRAVENOUS | Status: DC | PRN
  Administered 2016-05-31 – 2016-06-04 (×4): 5 mg via INTRAVENOUS
  Filled 2016-05-30 (×5): qty 5

## 2016-05-30 MED ORDER — ACETAMINOPHEN 650 MG RE SUPP: 650.0000 mg | RECTAL | Status: DC | PRN

## 2016-05-30 MED ORDER — SODIUM CHLORIDE 0.9 % IV SOLN
INTRAVENOUS | Status: DC
  Administered 2016-05-30 – 2016-05-31 (×4): via INTRAVENOUS

## 2016-05-30 MED ORDER — STROKE: EARLY STAGES OF RECOVERY BOOK
Freq: Once | Status: AC
  Administered 2016-05-30: 03:00:00
  Filled 2016-05-30: qty 1

## 2016-05-30 MED ORDER — PANTOPRAZOLE SODIUM 40 MG IV SOLR
40.0000 mg | Freq: Every day | INTRAVENOUS | Status: DC
  Administered 2016-05-30: 40 mg via INTRAVENOUS
  Filled 2016-05-30: qty 40

## 2016-05-30 MED ORDER — ASPIRIN 300 MG RE SUPP
300.0000 mg | Freq: Every day | RECTAL | Status: DC
  Administered 2016-05-31: 300 mg via RECTAL
  Filled 2016-05-30: qty 1

## 2016-05-30 MED ORDER — PERFLUTREN LIPID MICROSPHERE
2.0000 mL | INTRAVENOUS | Status: AC | PRN
  Administered 2016-05-30: 2 mL via INTRAVENOUS
  Filled 2016-05-30: qty 10

## 2016-05-30 NOTE — Progress Notes (Signed)
eLink Physician-Brief Progress Note Patient Name: Ryan Anderson DOB: [birth year] MRN: [id number]   Date of Service  05/30/2016  HPI/Events of Note  Acute RUE/ facial weakness s/p IV tpa - Acute left MCA CVA  New onset AF-RVR  eICU Interventions  Bp ok Defer IV heparin to neuro      Intervention Category Evaluation Type: New Patient Evaluation  ALVA,RAKESH V. 05/30/2016, 1:43 AM

## 2016-05-30 NOTE — Evaluation (Signed)
Speech Language Pathology Evaluation Patient Details Name: Ryan Anderson MRN: [id number] DOB: Jul 15, 1955 Today's Date: 05/30/2016 Time: 4098-1191 SLP Time Calculation (min) (ACUTE ONLY): 14 min  Problem List:  Patient Active Problem List   Diagnosis Date Noted  . CVA (cerebral vascular accident) (HCC) 05/30/2016  . OBESITY 02/17/2010  . CHEST PAIN, ATYPICAL 02/17/2010  . DEPRESSION 12/13/2009  . HYPERTENSION 12/13/2009  . INSOMNIA 12/13/2009  . NUMBNESS, HAND 12/13/2009   Past Medical History:  Past Medical History:  Diagnosis Date  . Brachial neuritis    Right-RESOLVED-(Weymann)  . Depressive disorder, not elsewhere classified    prev treated with celexa after wife died 2008/09/14  . Disturbance of skin sensation   . History of nephrolithiasis   . Hypogonadism male    BORDERLINE  . Insomnia, unspecified   . Obesity, unspecified   . Other chest pain   . Routine general medical examination at a health care facility   . Screening examination for venereal disease   . Unspecified essential hypertension    Past Surgical History:  Past Surgical History:  Procedure Laterality Date  . TONSILLECTOMY AND ADENOIDECTOMY  1962   HPI:  61 y.o.malewith a history of hypertension and obesity, brought to the ED with acute onset right-sided weakness and slurred speech. CT showed what appeared to be an evolcing left basal ganglia ischemic infarction. Pt had worsening of his speech and onset of expressive aphasia during tPA administration, but no signs of acute hemorrhage were noted on repeat CT, therefore tPA was resumed.   Assessment / Plan / Recommendation Clinical Impression  Pt has a mixed receptive and expressive fluent aphasia, with verbal expression mostly comprised of jargon and neologisms. He can repeat at the word to short-phrase level, although inconsistently. He is ~50% accurate with yes/no questions, and needs Mod visual/tactile cues to follow one step commands. His speech is  moderately dysarthric, although his significant aphasia limits further assessment of this. Throughout the evaluation, pt needed Mod cues to sustain alertness to participate in functional and structured tasks. Pt will need additional SLP f/u to maximize functional communication and independence.    SLP Assessment  Patient needs continued Speech Lanaguage Pathology Services    Follow Up Recommendations   (tba)    Frequency and Duration min 2x/week  2 weeks      SLP Evaluation Cognition  Overall Cognitive Status: Difficult to assess (aphasia) Arousal/Alertness: Lethargic Orientation Level: Oriented to person;Disoriented to place;Disoriented to time;Disoriented to situation Attention: Sustained Sustained Attention: Impaired Sustained Attention Impairment: Verbal basic       Comprehension  Auditory Comprehension Overall Auditory Comprehension: Impaired Yes/No Questions: Impaired Basic Biographical Questions: 76-100% accurate Basic Immediate Environment Questions: 25-49% accurate Complex Questions: 25-49% accurate Commands: Impaired One Step Basic Commands: 25-49% accurate    Expression Expression Primary Mode of Expression: Verbal Verbal Expression Overall Verbal Expression: Impaired Initiation: No impairment Automatic Speech: Name Level of Generative/Spontaneous Verbalization:  (jargon) Repetition: Impaired Level of Impairment: Word level Naming: Impairment Confrontation: Impaired Verbal Errors: Jargon;Not aware of errors   Oral / Motor  Oral Motor/Sensory Function Overall Oral Motor/Sensory Function: Moderate impairment (difficulty following commands to assess) Facial ROM: Reduced right;Suspected CN VII (facial) dysfunction Facial Symmetry: Abnormal symmetry right;Suspected CN VII (facial) dysfunction Motor Speech Overall Motor Speech: Impaired Respiration: Within functional limits Phonation: Normal Articulation: Impaired Level of Impairment: Word Intelligibility:   (difficult to assess given nonsensical words)   GO  Maxcine Hamaiewonsky, Kamuela Magos 05/30/2016, 1:43 PM  Maxcine HamLaura Paiewonsky, M.A. CCC-SLP [phone]

## 2016-05-30 NOTE — ED Notes (Signed)
tPA paused, Ryan RenoStewart MD asked to come to bedside. This RN noticed change in pt condition, pt now aphasic. Back to CT with this RN

## 2016-05-30 NOTE — Progress Notes (Signed)
  Received call regarding the patient going back into atrial fibrillation, HR in 110's - 120's. Remains NPO as he failed his swallow study. Will start IV Lopressor 5mg  q6H PRN as he converted with BB dosing yesterday evening. If he remains in atrial fibrillation, will need scheduled IV dosing and change to PO Lopressor or PO Cardizem once able to take PO's.   Plan is for DOAC once cleared by Neurology.   Signed, Ellsworth LennoxBrittany M Oak Dorey, PA-C 05/30/2016, 7:28 PM

## 2016-05-30 NOTE — ED Notes (Signed)
Pt back to room with RN, per Roseanne RenoStewart MD restart tPA

## 2016-05-30 NOTE — Progress Notes (Signed)
  Echocardiogram 2D Echocardiogram with Definity has been performed.  Ryan Anderson, Ryan Anderson 05/30/2016, 12:49 PM

## 2016-05-30 NOTE — Progress Notes (Signed)
PT Cancellation Note  Patient Details Name: Mearl LatinSamuel G Dimercurio MRN: [id number] DOB: November 10, 1955   Cancelled Treatment:    Reason Eval/Treat Not Completed: Patient not medically ready pt on bedrest s/p tPA. Will follow up once activity orders are increased.    Blake DivineShauna A Panhia Karl 05/30/2016, 9:00 AM Mylo RedShauna Correen Bubolz, PT, DPT [phone]

## 2016-05-30 NOTE — Progress Notes (Signed)
Upon 0330 NIH assessment, pt presented with worsening aphasia (unable to state name, age, month, or DOB/only incomprehensible words). He had been able to do so appropriately until that time. Informed neurology on-call MD.   MD informed RN that pt has been wax/waning with aphasia since start of tPA administration and at first occurrence tPA was stopped for a stat CT that showed no further changes. If all other aspects of NIH remaining the same - no need for further intervention at this time.  Will continue to monitor closely and complete NIHSS as required.  Francia GreavesSavannah R Quadry Kampa, RN

## 2016-05-30 NOTE — Progress Notes (Signed)
OT Cancellation Note  Patient Details Name: Ryan Anderson MRN: [id number] DOB: 1955/08/07   Cancelled Treatment:    Reason Eval/Treat Not Completed: Patient not medically ready pt on bedrest s/p tPA. Will follow up at later time once activity orders are increased.   Evette GeorgesLeonard, Jacquelina Hewins Eva [phone] 05/30/2016, 12:48 PM

## 2016-05-30 NOTE — Consult Note (Signed)
Cardiology Consult    Patient ID: Ryan Anderson MRN: [id number], DOB/AGE: 1956-01-11   Admit date: 05/28/2016 Date of Consult: 05/30/2016  Primary Physician: Ryan Givens, MD Reason for Consult: Afib Primary Cardiologist: New Requesting Provider: Dr. Pearlean Brownie  Patient Profile   [name] is a 61 year old male with no prior cardiac history. He was admitted on 05/20/2016 as a Code Stroke, also in Afib RVR.   History of Present Illness    [name] presented to the ED on 05/25/2016 with right sided paralysis, facial droop and slurred speech. 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 CT angiogram showed proximal left P2 PCA occlusion. There was no MCA occlusion.   He was in atrial fibrillation with RVR with rate on 111 on arrival to the ED. He was given TPA (which was paused for a short time as the patient developed aphasia) and transferred to 85M. He has no prior history of atrial fibrillation. He was markedly hypertensive and started on a cardene gtt.   He has converted to NSR spontaneously on 05/30/16 at 03:12 am. He is not currently on any anticoagulants at home. He has not been started on any anticoagulation as he received TPA at 00:31 on 05/30/16.    His EKG shows NSR with   Past Medical History   Past Medical History:  Diagnosis Date  . Brachial neuritis    Right-RESOLVED-([place])  . Depressive disorder, not elsewhere classified    prev treated with celexa after wife died 30-Sep-2008  . Disturbance of skin sensation   . History of nephrolithiasis   . Hypogonadism male    BORDERLINE  . Insomnia, unspecified   . Obesity, unspecified   . Other chest pain   . Routine general medical examination at a health care facility   . Screening examination for venereal disease   . Unspecified essential hypertension     Past Surgical History:  Procedure Laterality Date  . TONSILLECTOMY AND  ADENOIDECTOMY  1962     Allergies  No Known Allergies  Inpatient Medications    . insulin aspart  0-9 Units Subcutaneous Q4H  . pantoprazole (PROTONIX) IV  40 mg Intravenous QHS    Family History    Family History  Problem Relation Age of Onset  . Alcohol abuse Father   . Parkinsonism Father   . Hypertension Father     ?     Social History    Social History   Social History  . Marital status: Widowed    Spouse name: N/A  . Number of children: N/A  . Years of education: N/A   Occupational History  . Local farmer (cows and soybeans)    Social History Main Topics  . Smoking status: Never Smoker  . Smokeless tobacco: Not on file  . Alcohol use Yes     Comment: Occasional  . Drug use: No  . Sexual activity: Not on file   Other Topics Concern  . Not on file   Social History Narrative   Ryan Anderson   Wife died 09/30/2008     Review of Systems    General:  No chills, fever, night sweats or weight changes.  Cardiovascular:  No chest pain, dyspnea on exertion, edema, orthopnea, palpitations, paroxysmal nocturnal dyspnea. Dermatological: No rash, lesions/masses Respiratory: No cough, dyspnea Urologic: No hematuria, dysuria Abdominal:   No nausea, vomiting, diarrhea, bright red  blood per rectum, melena, or hematemesis Neurologic:  No visual changes, wkns, changes in mental status. All other systems reviewed and are otherwise negative except as noted above.  Physical Exam    Blood pressure (!) 145/79, pulse 81, temperature 98.1 F (36.7 C), temperature source Axillary, resp. rate 17, height 5' 8.9" (1.75 m), weight 279 lb 1.6 oz (126.6 kg), SpO2 96 %.  General: Pleasant, NAD Psych: Slow to respond.  Neuro: Lethargic, post CVA.. Right sided paralysis.  HEENT: Normal  Neck: Supple without bruits or JVD. Lungs:  Resp regular and unlabored, CTA. Heart: RRR no s3, s4, or murmurs. Abdomen: Soft, non-tender, non-distended, BS + x 4.  Extremities: No clubbing, cyanosis or  edema. DP/PT/Radials 2+ and equal bilaterally.  Labs    Troponin [facility] Test)  Recent Labs  05/17/2016 2321  TROPIPOC 0.03    Lab Results  Component Value Date   WBC 11.4 (H) 05/24/2016   HGB 17.0 05/10/2016   HCT 50.0 05/14/2016   MCV 90.0 05/23/2016   PLT 225 05/20/2016    Recent Labs Lab 05/08/2016 2315 05/25/2016 2323  NA 138 140  K 3.7 3.5  CL 101 100*  CO2 25  --   BUN 17 21*  CREATININE 1.26* 1.20  CALCIUM 9.4  --   PROT 7.3  --   BILITOT 0.7  --   ALKPHOS 103  --   ALT 31  --   AST 37  --   GLUCOSE 235* 237*   Lab Results  Component Value Date   CHOL 159 05/30/2016   HDL 41 05/30/2016   LDLCALC 106 (H) 05/30/2016   TRIG 58 05/30/2016   No results found for: [facility]   Radiology Studies    Ct Angio Head W Or Wo Contrast  Result Date: 05/30/2016 CLINICAL DATA:  Initial evaluation for acute right-sided weakness. EXAM: CT ANGIOGRAPHY HEAD AND NECK TECHNIQUE: Multidetector CT imaging of the head and neck was performed using the standard protocol during bolus administration of intravenous contrast. Multiplanar CT image reconstructions and MIPs were obtained to evaluate the vascular anatomy. Carotid stenosis measurements (when applicable) are obtained utilizing NASCET criteria, using the distal internal carotid diameter as the denominator. CONTRAST:  50 cc of Isovue 370. COMPARISON:  Prior CT from earlier the same day. FINDINGS: CTA NECK FINDINGS Aortic arch: Visualized aortic arch of normal caliber with normal branch pattern. No high-grade stenosis at the origin of the great vessels. Visualized subclavian arteries widely patent. Right carotid system: Right common carotid artery patent from its origin to the bifurcation. Minimal atheromatous plaque about the right bifurcation without stenosis. Right ICA widely patent to the skullbase without stenosis, dissection, or occlusion. Left carotid system: Left common carotid artery widely patent from its origin to the  bifurcation. Atheromatous plaque about the left bifurcation/proximal left ICA. Short-segment narrowing of approximately 35% by NASCET criteria. Distally left ICA widely patent to the skullbase without stenosis, dissection, or occlusion. Vertebral arteries: Both of the vertebral arteries arise from the subclavian arteries. Left vertebral artery dominant. Vertebral arteries widely patent within the neck without stenosis, dissection, or occlusion. Skeleton: Moderate multilevel degenerative spondylolysis within the cervical spine. No acute osseous abnormality. No worrisome lytic or blastic osseous lesions. Other neck: Soft tissues of the neck demonstrate no acute abnormality. No adenopathy. Upper chest: Visualized upper mediastinum within normal limits. Visualized lungs are grossly clear. The scattered calcified granulomas noted. Review of the MIP images confirms the above findings CTA HEAD FINDINGS Anterior circulation: Petrous  segments widely patent bilaterally. Minimal atheromatous plaque within the cavernous ICAs without significant stenosis. Supraclinoid segments widely patent. A1 segments patent. Anterior communicating artery not well visualized, and may be hypoplastic/absent. Short-segment severe right A2 stenosis noted (series 7, image 91). Mild atheromatous irregularity within the left ACA. ACA is are patent to their distal aspects. M1 segments patent without stenosis or occlusion. MCA bifurcations normal. No proximal M2 occlusion. Distal MCA branches opacified and symmetric. Multifocal atheromatous irregularity throughout the distal MCA branches bilaterally. Posterior circulation: Vertebral arteries patent to the vertebrobasilar junction without stenosis. Left vertebral artery dominant. Posterior inferior cerebral arteries patent bilaterally. Basilar artery widely patent. Superior cerebellar arteries patent bilaterally. Posterior cerebral artery supplied via the basilar artery. Right PCA demonstrates  multifocal atheromatous irregularity but is patent to its distal aspect. There is occlusion of the proximal left P 2 segment (Series 8, image 106). Absent flow distally. Venous sinuses: Patent. Anatomic variants: No significant anatomic variant. No aneurysm or vascular malformation. Delayed phase: Not performed. Review of the MIP images confirms the above findings IMPRESSION: 1. Abrupt occlusion of the proximal left P2 segment. 2. No other large or proximal arterial branch occlusion identified. 3. Short-segment severe right A2 stenosis. 4. Multifocal atheromatous irregularity involving the distal MCA branches bilaterally. 5. Short-segment atheromatous stenosis of approximately 35% at the proximal left ICA. Critical Value/emergent results were called by telephone at the time of interpretation on 05/30/2016 at 12:15 am to Dr. Noel ChristmasHARLES STEWART , who verbally acknowledged these results. In Electronically Signed   By: Rise MuBenjamin  McClintock M.D.   On: 05/30/2016 00:23   Ct Head Wo Contrast  Result Date: 05/30/2016 CLINICAL DATA:  Initial evaluation for new onset aphasia, previous code stroke. EXAM: CT HEAD WITHOUT CONTRAST TECHNIQUE: Contiguous axial images were obtained from the base of the skull through the vertex without intravenous contrast. COMPARISON:  Prior CT and CTA from earlier the same day. FINDINGS: Brain: Again seen is an evolving acute ischemic infarct involving the left basal ganglia, with involvement of the left caudate and lentiform nucleus as well as the anterior aspect of the left internal capsule. No significant mass effect. Additional patchy hypodensity more superiorly within the left frontal lobe again seen as well, also concerning for small foci of evolving ischemia. These are overall relatively similar to previous. There is subtle loss of gray-white matter differentiation within the parasagittal left occipital lobe with extension into the mesial left temporal lobe, concerning for evolving ischemia  (series 2, image 15). This is consistent with previously identified left P2 occlusion. No significant mass effect. No other definite evidence for acute large vessel territory infarct. Gray-white matter differentiation otherwise maintained. No definite acute intracranial hemorrhage, although evaluation mildly limited as IV contrast is on board. Probable minimal patchy enhancement about the left frontal lobe infarcts noted. Remote right basal ganglia infarct again noted. No mass lesion, midline shift, or mass effect. Ventricle stable in size without evidence for hydrocephalus. No extra-axial fluid collection. Vascular: IV contrast throughout the intracranial vasculature related to recent CTA. Skull: Scalp soft tissues within normal limits.  Calvarium intact. Sinuses/Orbits: Globes and orbits demonstrate no acute abnormality. Patient gaze is directed to the left. Moderate paranasal sinus disease again noted. Mastoids remain largely clear. IMPRESSION: 1. Similar appearance of evolving left MCA territory infarcts involving the left basal ganglia and probable anterior left frontal lobe. 2. Subtle loss of gray-white matter differentiation within the parasagittal left tempo-occipital region, suspicious for evolving left PCA territory infarct. This is compatible with previously identified  left P2 occlusion. 3. No other new acute intracranial process identified. Electronically Signed   By: Rise Mu M.D.   On: 05/30/2016 01:24   Ct Angio Neck W Or Wo Contrast  Result Date: 05/30/2016 CLINICAL DATA:  Initial evaluation for acute right-sided weakness. EXAM: CT ANGIOGRAPHY HEAD AND NECK TECHNIQUE: Multidetector CT imaging of the head and neck was performed using the standard protocol during bolus administration of intravenous contrast. Multiplanar CT image reconstructions and MIPs were obtained to evaluate the vascular anatomy. Carotid stenosis measurements (when applicable) are obtained utilizing NASCET criteria,  using the distal internal carotid diameter as the denominator. CONTRAST:  50 cc of Isovue 370. COMPARISON:  Prior CT from earlier the same day. FINDINGS: CTA NECK FINDINGS Aortic arch: Visualized aortic arch of normal caliber with normal branch pattern. No high-grade stenosis at the origin of the great vessels. Visualized subclavian arteries widely patent. Right carotid system: Right common carotid artery patent from its origin to the bifurcation. Minimal atheromatous plaque about the right bifurcation without stenosis. Right ICA widely patent to the skullbase without stenosis, dissection, or occlusion. Left carotid system: Left common carotid artery widely patent from its origin to the bifurcation. Atheromatous plaque about the left bifurcation/proximal left ICA. Short-segment narrowing of approximately 35% by NASCET criteria. Distally left ICA widely patent to the skullbase without stenosis, dissection, or occlusion. Vertebral arteries: Both of the vertebral arteries arise from the subclavian arteries. Left vertebral artery dominant. Vertebral arteries widely patent within the neck without stenosis, dissection, or occlusion. Skeleton: Moderate multilevel degenerative spondylolysis within the cervical spine. No acute osseous abnormality. No worrisome lytic or blastic osseous lesions. Other neck: Soft tissues of the neck demonstrate no acute abnormality. No adenopathy. Upper chest: Visualized upper mediastinum within normal limits. Visualized lungs are grossly clear. The scattered calcified granulomas noted. Review of the MIP images confirms the above findings CTA HEAD FINDINGS Anterior circulation: Petrous segments widely patent bilaterally. Minimal atheromatous plaque within the cavernous ICAs without significant stenosis. Supraclinoid segments widely patent. A1 segments patent. Anterior communicating artery not well visualized, and may be hypoplastic/absent. Short-segment severe right A2 stenosis noted (series 7,  image 91). Mild atheromatous irregularity within the left ACA. ACA is are patent to their distal aspects. M1 segments patent without stenosis or occlusion. MCA bifurcations normal. No proximal M2 occlusion. Distal MCA branches opacified and symmetric. Multifocal atheromatous irregularity throughout the distal MCA branches bilaterally. Posterior circulation: Vertebral arteries patent to the vertebrobasilar junction without stenosis. Left vertebral artery dominant. Posterior inferior cerebral arteries patent bilaterally. Basilar artery widely patent. Superior cerebellar arteries patent bilaterally. Posterior cerebral artery supplied via the basilar artery. Right PCA demonstrates multifocal atheromatous irregularity but is patent to its distal aspect. There is occlusion of the proximal left P 2 segment (Series 8, image 106). Absent flow distally. Venous sinuses: Patent. Anatomic variants: No significant anatomic variant. No aneurysm or vascular malformation. Delayed phase: Not performed. Review of the MIP images confirms the above findings IMPRESSION: 1. Abrupt occlusion of the proximal left P2 segment. 2. No other large or proximal arterial branch occlusion identified. 3. Short-segment severe right A2 stenosis. 4. Multifocal atheromatous irregularity involving the distal MCA branches bilaterally. 5. Short-segment atheromatous stenosis of approximately 35% at the proximal left ICA. Critical Value/emergent results were called by telephone at the time of interpretation on 05/30/2016 at 12:15 am to Dr. Noel Christmas , who verbally acknowledged these results. In Electronically Signed   By: Rise Mu M.D.   On: 05/30/2016 00:23  Ct Head Code Stroke W/o Cm  Result Date: 06/02/2016 CLINICAL DATA:  Code stroke. Initial evaluation for acute right-sided weakness. EXAM: CT HEAD WITHOUT CONTRAST TECHNIQUE: Contiguous axial images were obtained from the base of the skull through the vertex without intravenous  contrast. COMPARISON:  None available. FINDINGS: Brain: Age-related cerebral atrophy with mild chronic microvascular ischemic disease. Remote lacunar infarct present within the right basal ganglia. There is evolving hypodensity within the left basal ganglia out, involving the caudate and lentiform nucleus, as well as the anterior limb of the left internal capsule. Additional patchy hypodensity within the anterior left frontal lobe may reflect additional small foci of evolving ischemia (series 2, image 23, 25). No other definite evidence for evolving ischemia. No acute intracranial hemorrhage. No mass lesion, midline shift, or mass effect. No hydrocephalus. No extra-axial fluid collection. Vascular: Vasculature mildly hyper dense in appearance, but is diffusely symmetric without definite hyperdense vessel. Skull: Scalp soft tissues within normal limits.  Calvarium intact. Sinuses/Orbits: Globes and orbits within normal limits. Moderate mucosal thickening within the maxillary sinuses and ethmoidal air cells. Mastoids are clear. A duct Stewart av spine about 5 on a a has an evolving left basal ganglia infarct and al in additional possible patchy involvement within knee at anterior left frontal lobe of the supra ganglionic territory heads with what more on sure a solid at the a half media for PE at this this would be consistent with the left MCA ex it at ages vessels Inderal kind of bright echo hypo seated definite of the well ball pool sleeve with back 6 but in the ASPECTS [facility] Stroke Program Early CT Score) - Ganglionic level infarction (caudate, lentiform nuclei, internal capsule, insula, M1-M3 cortex): 4 - Supraganglionic infarction (M4-M6 cortex): 2 Total score (0-10 with 10 being normal): 6 IMPRESSION: 1. Acute evolving left basal ganglia infarct as above, with additional possible patchy involvement within the supra ganglionic anterior left frontal lobe. 2. ASPECTS is 6 3. Remote right basal ganglia infarct.  4. Mild chronic small vessel ischemic disease. Critical Value/emergent results were called by telephone at the time of interpretation on 05/28/2016 at 11:45 pm to Dr. Roseanne Reno he, who verbally acknowledged these results. Electronically Signed   By: Rise Mu M.D.   On: 05/16/2016 23:46    EKG & Cardiac Imaging    EKG: NSR  Echocardiogram: pending.   Assessment & Plan    1. Atrial fibrillation with RVR: Presented with acute CVA and rapid Afib without a history of Afib. Now in NSR, converted spontaneously.  This patients CHA2DS2-VASc Score and unadjusted Ischemic Stroke Rate (% per year) is equal to 3.2 % stroke rate/year from a score of 3 Above score calculated as 1 point each if present [CHF, HTN, DM, Vascular=MI/PAD/Aortic Plaque, Age if 65-74, or Male], 2 points each if present [Age > 75, or Stroke/TIA/TE]  Will need anticoagulation, would start DOAC when Neuro deems this is safe.   Echo pending.   Signed, Little Ishikawa, NP 05/30/2016, 12:04 PM Pager: [phone] Patient seen and examined and history reviewed. Agree with above findings and plan.61 yo WM with history of HTN- untreated presents with acute ischemic CVA treated with TPA. Was in AFib with mildly elevated rate on arrival- has since converted to NSR. Severely hypertensive on admit. No prior history of arrhythmia or cardiac issues.  On exam- obese WM in NAD, lethargic with right sided paralysis Lungs clear.  CV RRR without gallop or murmur Abd soft NT  Ecg with NSR  nonspecific ST changes.  Echo pending  Impression: new onset Afib now converted to NSR 2. Acute ischemic CVA 3. Uncontrolled HTN 4. Obesity.  Plan: anticoagulation with DOAC when OK with neuro.  Will check Echo.  Gradual BP control - currently with IV cardene- switch to po meds when stable. Statin therapy   Lovada Barwick [place], MDFACC 05/30/2016 1:12 PM

## 2016-05-30 NOTE — Code Documentation (Signed)
Code Stroke called on pt arriving to ED with R sided paralysis, facial droop, and slurred speech.  [id number]. Cbg-201, BP 200s/130s.  Afib with RVR(no history). NIH-13.  Labetolol given x 2 for htn and Cardene gtt started by ED RN.  TPA given.  Pt became aphasic during administration.  TPA paused and CT redone.  TPA then restarted.  Pt transferred to 3M07.

## 2016-05-30 NOTE — H&P (Signed)
Admission H&P    Chief Complaint: New onset right-sided weakness and slurred speech.  HPI: Ryan Anderson is an 61 y.o. male with a history of hypertension and obesity, brought to the ED in code stroke status following acute onset of weakness involving right face and extremities along with slurred speech. Onset was at 9:45 PM. He has no previous history of stroke nor TIA. He was found to be in atrial fibrillation with RVR which had not previously been recognized. His blood pressure was markedly elevated and he had hyperglycemia in the 200s. 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 CT angiogram showed proximal left P2 PCA occlusion. There was no MCA occlusion. Patient was deemed a candidate for TPA which was administered after getting his blood pressure controlled which required Cardene drip after labetalol roof to be ineffective. There was worsening of his speech with onset of expressive aphasia during TPA infusion. TPA was suspended and repeat CT scan of the head was obtained which showed no signs of acute hemorrhage. TPA was resumed. Patient was not deemed a candidate for IR intervention, and was admitted to neuro ICU for further management.  LSN: 9:45 PM on 05/30/2016 tPA Given: Yes. tPA was delayed because of effort required to manage patient's hypertensive urgency. mRankin:  Past Medical History:  Diagnosis Date  . Brachial neuritis    Right-RESOLVED-([place])  . Depressive disorder, not elsewhere classified    prev treated with celexa after wife died 2008-07-14  . Disturbance of skin sensation   . History of nephrolithiasis   . Hypogonadism male    BORDERLINE  . Insomnia, unspecified   . Obesity, unspecified   . Other chest pain   . Routine general medical examination at a health care facility   . Screening examination for venereal disease   . Unspecified essential hypertension      Past Surgical History:  Procedure Laterality Date  . TONSILLECTOMY AND ADENOIDECTOMY  1962    Family History  Problem Relation Age of Onset  . Alcohol abuse Father   . Parkinsonism Father   . Hypertension Father     ?    Social History:  reports that he has never smoked. He does not have any smokeless tobacco history on file. He reports that he drinks alcohol. He reports that he does not use drugs.  Allergies: No Known Allergies  No prescriptions prior to admission.    ROS: History obtained from the patient  General ROS: negative for - chills, fatigue, fever, night sweats, weight gain or weight loss Psychological ROS: negative for - behavioral disorder, hallucinations, memory difficulties, mood swings or suicidal ideation Ophthalmic ROS: negative for - blurry vision, double vision, eye pain or loss of vision ENT ROS: negative for - epistaxis, nasal discharge, oral lesions, sore throat, tinnitus or vertigo Allergy and Immunology ROS: negative for - hives or itchy/watery eyes Hematological and Lymphatic ROS: negative for - bleeding problems, bruising or swollen lymph nodes Endocrine ROS: negative for - galactorrhea, hair pattern changes, polydipsia/polyuria or temperature intolerance Respiratory ROS: negative for - cough, hemoptysis, shortness of breath or wheezing Cardiovascular ROS: negative for - chest pain, dyspnea on exertion, edema or irregular heartbeat Gastrointestinal ROS: negative for - abdominal pain, diarrhea, hematemesis, nausea/vomiting or stool incontinence Genito-Urinary ROS: negative for - dysuria, hematuria, incontinence or urinary frequency/urgency Musculoskeletal ROS: negative for - joint swelling or muscular weakness Neurological ROS: as  noted in HPI Dermatological ROS: negative for rash and skin lesion changes  Physical Examination: Blood pressure 137/71, pulse 107, temperature 98.5 F (36.9 C), resp. rate 19, height 5' 8.9" (1.75 m), weight 126.6 kg  (279 lb 1.6 oz), SpO2 94 %.  HEENT-  Normocephalic, no lesions, without obvious abnormality.  Normal external eye and conjunctiva.  Normal TM's bilaterally.  Normal auditory canals and external ears. Normal external nose, mucus membranes and septum.  Normal pharynx. Neck supple with no masses, nodes, nodules or enlargement. Cardiovascular - irregularly irregular rhythm, S1, S2 normal and no S3 or S4 Lungs - chest clear, no wheezing, rales, normal symmetric air entry Abdomen - soft, non-tender; bowel sounds normal; no masses,  no organomegaly Extremities - no joint deformities, effusion, or inflammation  Neurologic Examination: Mental Status: Alert, oriented, no acute distress.  Speech moderately slurred without evidence of aphasia. Able to follow commands. Cranial Nerves: II-dense right homonymous hemianopsia. III/IV/VI-Pupils were equal and reacted. Extraocular movements were full and conjugate.    V/VII-reduced perception of tactile sensation over the right side of his face compared to left; moderate right lower facial weakness. VIII-normal. X-moderate dysarthria. Motor: Flaccid right hemiparesis with greater involvement of upper extremities and lower extremity; motor exam otherwise unremarkable. Sensory: Reduced perception of tactile sensation over right extremities compared to left extremities. Deep Tendon Reflexes: Trace to 1+ and symmetric. Plantars: Mute bilaterally Cerebellar: Normal finger-to-nose testing with use of left upper extremity.   Results for orders placed or performed during the hospital encounter of 05/27/2016 (from the past 48 hour(s))  CBG monitoring, ED     Status: Abnormal   Collection Time: 05/31/2016 11:11 PM  Result Value Ref Range   Glucose-Capillary 221 (H) 65 - 99 mg/dL  Ethanol     Status: None   Collection Time: 05/08/2016 11:15 PM  Result Value Ref Range   Alcohol, Ethyl (B) <5 <5 mg/dL    Comment:        LOWEST DETECTABLE LIMIT FOR SERUM ALCOHOL IS 5  mg/dL FOR MEDICAL PURPOSES ONLY   Protime-INR     Status: None   Collection Time: 05/26/2016 11:15 PM  Result Value Ref Range   Prothrombin Time 12.7 11.4 - 15.2 seconds   INR 0.95   APTT     Status: None   Collection Time: 05/31/2016 11:15 PM  Result Value Ref Range   aPTT 30 24 - 36 seconds  CBC     Status: Abnormal   Collection Time: 05/20/2016 11:15 PM  Result Value Ref Range   WBC 11.4 (H) 4.0 - 10.5 K/uL   RBC 5.29 4.22 - 5.81 MIL/uL   Hemoglobin 16.9 13.0 - 17.0 g/dL   HCT 47.6 39.0 - 52.0 %   MCV 90.0 78.0 - 100.0 fL   MCH 31.9 26.0 - 34.0 pg   MCHC 35.5 30.0 - 36.0 g/dL   RDW 12.7 11.5 - 15.5 %   Platelets 225 150 - 400 K/uL  Differential     Status: Abnormal   Collection Time: 05/16/2016 11:15 PM  Result Value Ref Range   Neutrophils Relative % 70 %   Neutro Abs 8.1 (H) 1.7 - 7.7 K/uL   Lymphocytes Relative 20 %   Lymphs Abs 2.3 0.7 - 4.0 K/uL   Monocytes Relative 7 %   Monocytes Absolute 0.8 0.1 - 1.0 K/uL   Eosinophils Relative 3 %   Eosinophils Absolute 0.3 0.0 - 0.7 K/uL   Basophils Relative 0 %   Basophils Absolute  0.0 0.0 - 0.1 K/uL  Comprehensive metabolic panel     Status: Abnormal   Collection Time: 05/18/2016 11:15 PM  Result Value Ref Range   Sodium 138 135 - 145 mmol/L   Potassium 3.7 3.5 - 5.1 mmol/L   Chloride 101 101 - 111 mmol/L   CO2 25 22 - 32 mmol/L   Glucose, Bld 235 (H) 65 - 99 mg/dL   BUN 17 6 - 20 mg/dL   Creatinine, Ser 1.26 (H) 0.61 - 1.24 mg/dL   Calcium 9.4 8.9 - 10.3 mg/dL   Total Protein 7.3 6.5 - 8.1 g/dL   Albumin 3.7 3.5 - 5.0 g/dL   AST 37 15 - 41 U/L   ALT 31 17 - 63 U/L   Alkaline Phosphatase 103 38 - 126 U/L   Total Bilirubin 0.7 0.3 - 1.2 mg/dL   GFR calc non Af Amer 59 (L) >60 mL/min   GFR calc Af Amer >60 >60 mL/min    Comment: (NOTE) The eGFR has been calculated using the CKD EPI equation. This calculation has not been validated in all clinical situations. eGFR's persistently <60 mL/min signify possible Chronic  Kidney Disease.    Anion gap 12 5 - 15  I-stat troponin, ED (not at [facility], [facility])     Status: None   Collection Time: 05/18/2016 11:21 PM  Result Value Ref Range   Troponin i, poc 0.03 0.00 - 0.08 ng/mL   Comment 3            Comment: Due to the release kinetics of cTnI, a negative result within the first hours of the onset of symptoms does not rule out myocardial infarction with certainty. If myocardial infarction is still suspected, repeat the test at appropriate intervals.   I-Stat Chem 8, ED  (not at [facility], [facility])     Status: Abnormal   Collection Time: 05/28/2016 11:23 PM  Result Value Ref Range   Sodium 140 135 - 145 mmol/L   Potassium 3.5 3.5 - 5.1 mmol/L   Chloride 100 (L) 101 - 111 mmol/L   BUN 21 (H) 6 - 20 mg/dL   Creatinine, Ser 1.20 0.61 - 1.24 mg/dL   Glucose, Bld 237 (H) 65 - 99 mg/dL   Calcium, Ion 1.13 (L) 1.15 - 1.40 mmol/L   TCO2 30 0 - 100 mmol/L   Hemoglobin 17.0 13.0 - 17.0 g/dL   HCT 50.0 39.0 - 52.0 %  Urine rapid drug screen (hosp performed)not at [facility]     Status: None   Collection Time: 05/19/2016 11:36 PM  Result Value Ref Range   Opiates NONE DETECTED NONE DETECTED   Cocaine NONE DETECTED NONE DETECTED   Benzodiazepines NONE DETECTED NONE DETECTED   Amphetamines NONE DETECTED NONE DETECTED   Tetrahydrocannabinol NONE DETECTED NONE DETECTED   Barbiturates NONE DETECTED NONE DETECTED    Comment:        DRUG SCREEN FOR MEDICAL PURPOSES ONLY.  IF CONFIRMATION IS NEEDED FOR ANY PURPOSE, NOTIFY LAB WITHIN 5 DAYS.        LOWEST DETECTABLE LIMITS FOR URINE DRUG SCREEN Drug Class       Cutoff (ng/mL) Amphetamine      1000 Barbiturate      200 Benzodiazepine   222 Tricyclics       979 Opiates          300 Cocaine          300 THC  50   Urinalysis, Routine w reflex microscopic     Status: Abnormal   Collection Time: 05/14/2016 11:36 PM  Result Value Ref Range   Color, Urine YELLOW YELLOW   APPearance CLEAR CLEAR   Specific Gravity, Urine  1.011 1.005 - 1.030   pH 7.0 5.0 - 8.0   Glucose, UA >=500 (A) NEGATIVE mg/dL   Hgb urine dipstick SMALL (A) NEGATIVE   Bilirubin Urine NEGATIVE NEGATIVE   Ketones, ur NEGATIVE NEGATIVE mg/dL   Protein, ur 100 (A) NEGATIVE mg/dL   Nitrite NEGATIVE NEGATIVE   Leukocytes, UA NEGATIVE NEGATIVE   RBC / HPF 0-5 0 - 5 RBC/hpf   WBC, UA 0-5 0 - 5 WBC/hpf   Bacteria, UA NONE SEEN NONE SEEN   Squamous Epithelial / LPF NONE SEEN NONE SEEN   Ct Angio Head W Or Wo Contrast  Result Date: 05/30/2016 CLINICAL DATA:  Initial evaluation for acute right-sided weakness. EXAM: CT ANGIOGRAPHY HEAD AND NECK TECHNIQUE: Multidetector CT imaging of the head and neck was performed using the standard protocol during bolus administration of intravenous contrast. Multiplanar CT image reconstructions and MIPs were obtained to evaluate the vascular anatomy. Carotid stenosis measurements (when applicable) are obtained utilizing NASCET criteria, using the distal internal carotid diameter as the denominator. CONTRAST:  50 cc of Isovue 370. COMPARISON:  Prior CT from earlier the same day. FINDINGS: CTA NECK FINDINGS Aortic arch: Visualized aortic arch of normal caliber with normal branch pattern. No high-grade stenosis at the origin of the great vessels. Visualized subclavian arteries widely patent. Right carotid system: Right common carotid artery patent from its origin to the bifurcation. Minimal atheromatous plaque about the right bifurcation without stenosis. Right ICA widely patent to the skullbase without stenosis, dissection, or occlusion. Left carotid system: Left common carotid artery widely patent from its origin to the bifurcation. Atheromatous plaque about the left bifurcation/proximal left ICA. Short-segment narrowing of approximately 35% by NASCET criteria. Distally left ICA widely patent to the skullbase without stenosis, dissection, or occlusion. Vertebral arteries: Both of the vertebral arteries arise from the  subclavian arteries. Left vertebral artery dominant. Vertebral arteries widely patent within the neck without stenosis, dissection, or occlusion. Skeleton: Moderate multilevel degenerative spondylolysis within the cervical spine. No acute osseous abnormality. No worrisome lytic or blastic osseous lesions. Other neck: Soft tissues of the neck demonstrate no acute abnormality. No adenopathy. Upper chest: Visualized upper mediastinum within normal limits. Visualized lungs are grossly clear. The scattered calcified granulomas noted. Review of the MIP images confirms the above findings CTA HEAD FINDINGS Anterior circulation: Petrous segments widely patent bilaterally. Minimal atheromatous plaque within the cavernous ICAs without significant stenosis. Supraclinoid segments widely patent. A1 segments patent. Anterior communicating artery not well visualized, and may be hypoplastic/absent. Short-segment severe right A2 stenosis noted (series 7, image 91). Mild atheromatous irregularity within the left ACA. ACA is are patent to their distal aspects. M1 segments patent without stenosis or occlusion. MCA bifurcations normal. No proximal M2 occlusion. Distal MCA branches opacified and symmetric. Multifocal atheromatous irregularity throughout the distal MCA branches bilaterally. Posterior circulation: Vertebral arteries patent to the vertebrobasilar junction without stenosis. Left vertebral artery dominant. Posterior inferior cerebral arteries patent bilaterally. Basilar artery widely patent. Superior cerebellar arteries patent bilaterally. Posterior cerebral artery supplied via the basilar artery. Right PCA demonstrates multifocal atheromatous irregularity but is patent to its distal aspect. There is occlusion of the proximal left P 2 segment (Series 8, image 106). Absent flow distally. Venous sinuses: Patent. Anatomic variants: No  significant anatomic variant. No aneurysm or vascular malformation. Delayed phase: Not  performed. Review of the MIP images confirms the above findings IMPRESSION: 1. Abrupt occlusion of the proximal left P2 segment. 2. No other large or proximal arterial branch occlusion identified. 3. Short-segment severe right A2 stenosis. 4. Multifocal atheromatous irregularity involving the distal MCA branches bilaterally. 5. Short-segment atheromatous stenosis of approximately 35% at the proximal left ICA. Critical Value/emergent results were called by telephone at the time of interpretation on 05/30/2016 at 12:15 am to Dr. Wallie Char , who verbally acknowledged these results. In Electronically Signed   By: Jeannine Boga M.D.   On: 05/30/2016 00:23   Ct Angio Neck W Or Wo Contrast  Result Date: 05/30/2016 CLINICAL DATA:  Initial evaluation for acute right-sided weakness. EXAM: CT ANGIOGRAPHY HEAD AND NECK TECHNIQUE: Multidetector CT imaging of the head and neck was performed using the standard protocol during bolus administration of intravenous contrast. Multiplanar CT image reconstructions and MIPs were obtained to evaluate the vascular anatomy. Carotid stenosis measurements (when applicable) are obtained utilizing NASCET criteria, using the distal internal carotid diameter as the denominator. CONTRAST:  50 cc of Isovue 370. COMPARISON:  Prior CT from earlier the same day. FINDINGS: CTA NECK FINDINGS Aortic arch: Visualized aortic arch of normal caliber with normal branch pattern. No high-grade stenosis at the origin of the great vessels. Visualized subclavian arteries widely patent. Right carotid system: Right common carotid artery patent from its origin to the bifurcation. Minimal atheromatous plaque about the right bifurcation without stenosis. Right ICA widely patent to the skullbase without stenosis, dissection, or occlusion. Left carotid system: Left common carotid artery widely patent from its origin to the bifurcation. Atheromatous plaque about the left bifurcation/proximal left ICA.  Short-segment narrowing of approximately 35% by NASCET criteria. Distally left ICA widely patent to the skullbase without stenosis, dissection, or occlusion. Vertebral arteries: Both of the vertebral arteries arise from the subclavian arteries. Left vertebral artery dominant. Vertebral arteries widely patent within the neck without stenosis, dissection, or occlusion. Skeleton: Moderate multilevel degenerative spondylolysis within the cervical spine. No acute osseous abnormality. No worrisome lytic or blastic osseous lesions. Other neck: Soft tissues of the neck demonstrate no acute abnormality. No adenopathy. Upper chest: Visualized upper mediastinum within normal limits. Visualized lungs are grossly clear. The scattered calcified granulomas noted. Review of the MIP images confirms the above findings CTA HEAD FINDINGS Anterior circulation: Petrous segments widely patent bilaterally. Minimal atheromatous plaque within the cavernous ICAs without significant stenosis. Supraclinoid segments widely patent. A1 segments patent. Anterior communicating artery not well visualized, and may be hypoplastic/absent. Short-segment severe right A2 stenosis noted (series 7, image 91). Mild atheromatous irregularity within the left ACA. ACA is are patent to their distal aspects. M1 segments patent without stenosis or occlusion. MCA bifurcations normal. No proximal M2 occlusion. Distal MCA branches opacified and symmetric. Multifocal atheromatous irregularity throughout the distal MCA branches bilaterally. Posterior circulation: Vertebral arteries patent to the vertebrobasilar junction without stenosis. Left vertebral artery dominant. Posterior inferior cerebral arteries patent bilaterally. Basilar artery widely patent. Superior cerebellar arteries patent bilaterally. Posterior cerebral artery supplied via the basilar artery. Right PCA demonstrates multifocal atheromatous irregularity but is patent to its distal aspect. There is  occlusion of the proximal left P 2 segment (Series 8, image 106). Absent flow distally. Venous sinuses: Patent. Anatomic variants: No significant anatomic variant. No aneurysm or vascular malformation. Delayed phase: Not performed. Review of the MIP images confirms the above findings IMPRESSION: 1. Abrupt occlusion of  the proximal left P2 segment. 2. No other large or proximal arterial branch occlusion identified. 3. Short-segment severe right A2 stenosis. 4. Multifocal atheromatous irregularity involving the distal MCA branches bilaterally. 5. Short-segment atheromatous stenosis of approximately 35% at the proximal left ICA. Critical Value/emergent results were called by telephone at the time of interpretation on 05/30/2016 at 12:15 am to Dr. Noel Christmas , who verbally acknowledged these results. In Electronically Signed   By: Rise Mu M.D.   On: 05/30/2016 00:23   Ct Head Code Stroke W/o Cm  Result Date: 05/31/2016 CLINICAL DATA:  Code stroke. Initial evaluation for acute right-sided weakness. EXAM: CT HEAD WITHOUT CONTRAST TECHNIQUE: Contiguous axial images were obtained from the base of the skull through the vertex without intravenous contrast. COMPARISON:  None available. FINDINGS: Brain: Age-related cerebral atrophy with mild chronic microvascular ischemic disease. Remote lacunar infarct present within the right basal ganglia. There is evolving hypodensity within the left basal ganglia out, involving the caudate and lentiform nucleus, as well as the anterior limb of the left internal capsule. Additional patchy hypodensity within the anterior left frontal lobe may reflect additional small foci of evolving ischemia (series 2, image 23, 25). No other definite evidence for evolving ischemia. No acute intracranial hemorrhage. No mass lesion, midline shift, or mass effect. No hydrocephalus. No extra-axial fluid collection. Vascular: Vasculature mildly hyper dense in appearance, but is diffusely  symmetric without definite hyperdense vessel. Skull: Scalp soft tissues within normal limits.  Calvarium intact. Sinuses/Orbits: Globes and orbits within normal limits. Moderate mucosal thickening within the maxillary sinuses and ethmoidal air cells. Mastoids are clear. A duct Natanya Holecek av spine about 5 on a a has an evolving left basal ganglia infarct and al in additional possible patchy involvement within knee at anterior left frontal lobe of the supra ganglionic territory heads with what more on sure a solid at the a half media for PE at this this would be consistent with the left MCA ex it at ages vessels Inderal kind of bright echo hypo seated definite of the well ball pool sleeve with back 6 but in the ASPECTS [facility] Stroke Program Early CT Score) - Ganglionic level infarction (caudate, lentiform nuclei, internal capsule, insula, M1-M3 cortex): 4 - Supraganglionic infarction (M4-M6 cortex): 2 Total score (0-10 with 10 being normal): 6 IMPRESSION: 1. Acute evolving left basal ganglia infarct as above, with additional possible patchy involvement within the supra ganglionic anterior left frontal lobe. 2. ASPECTS is 6 3. Remote right basal ganglia infarct. 4. Mild chronic small vessel ischemic disease. Critical Value/emergent results were called by telephone at the time of interpretation on 05/18/2016 at 11:45 pm to Dr. Roseanne Reno he, who verbally acknowledged these results. Electronically Signed   By: Rise Mu M.D.   On: 05/26/2016 23:46    Assessment: 61 y.o. 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  Stroke Risk Factors - atrial fibrillation, diabetes mellitus and hypertension  Plan: 1. HgbA1c, fasting lipid panel 2. MRI of the brain without contrast 3. PT consult, OT consult, Speech consult 4. Echocardiogram 5. Prophylactic therapy-Antiplatelet med: Likely  anticoagulation 6. Risk factor modification 7. Telemetry monitoring  This patient is critically ill and at significant risk of neurological worsening or death, and care requires constant monitoring of vital signs, hemodynamics,respiratory and cardiac monitoring, neurological assessment, discussion with family, other specialists and medical decision making of high complexity. Total critical care time was  90 minutes.  C.R. Nicole Kindred, MD [facility] [phone]  05/30/2016, 1:12 AM

## 2016-05-30 NOTE — Progress Notes (Signed)
STROKE TEAM PROGRESS NOTE   HISTORY OF PRESENT ILLNESS (per record) Ryan Anderson is an 61 y.o. male with a history of hypertension and obesity, brought to the ED in code stroke status following acute onset of weakness involving right face and extremities along with slurred speech. Onset was at 9:45 PM. He has no previous history of stroke nor TIA. He was found to be in atrial fibrillation with RVR which had not previously been recognized. His blood pressure was markedly elevated and he had hyperglycemia in the 200s. 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 CT angiogram showed proximal left P2 PCA occlusion. There was no MCA occlusion. Patient was deemed a candidate for TPA which was administered after getting his blood pressure controlled which required Cardene drip after labetalol proved to be ineffective. There was worsening of his speech with onset of expressive aphasia during TPA infusion. TPA was suspended and repeat CT scan of the head was obtained which showed no signs of acute hemorrhage. TPA was resumed. Patient was not deemed a candidate for IR intervention, and was admitted to neuro ICU for further management.  LSN: 9:45 PM on 17-Jun-2016 tPA Given: Yes. tPA was delayed because of effort required to manage patient's hypertensive urgency. mRankin:   SUBJECTIVE (INTERVAL HISTORY) The patient's sister and his son report the bedside. The patient was lethargic and aphasic. Meaningful communication was not possible at this time.   OBJECTIVE Temp:  [97.6 F (36.4 C)-98.9 F (37.2 C)] 98.1 F (36.7 C) (01/23 1200) Pulse Rate:  [61-136] 76 (01/23 1400) Cardiac Rhythm: Atrial fibrillation (01/23 0800) Resp:  [15-27] 15 (01/23 1400) BP: (114-220)/(68-128) 145/80 (01/23 1400) SpO2:  [91 %-100 %] 98 % (01/23 1400) Weight:  [126.6 kg (279 lb 1.6 oz)] 126.6 kg (279 lb 1.6 oz) (01/22  2342)  CBC:   Recent Labs Lab 06-17-2016 2315 06/17/2016 2323  WBC 11.4*  --   NEUTROABS 8.1*  --   HGB 16.9 17.0  HCT 47.6 50.0  MCV 90.0  --   PLT 225  --     Basic Metabolic Panel:   Recent Labs Lab 06-17-16 2315 2016/06/17 2323  NA 138 140  K 3.7 3.5  CL 101 100*  CO2 25  --   GLUCOSE 235* 237*  BUN 17 21*  CREATININE 1.26* 1.20  CALCIUM 9.4  --     Lipid Panel:     Component Value Date/Time   CHOL 159 05/30/2016 0340   TRIG 58 05/30/2016 0340   HDL 41 05/30/2016 0340   CHOLHDL 3.9 05/30/2016 0340   VLDL 12 05/30/2016 0340   LDLCALC 106 (H) 05/30/2016 0340   HgbA1c: No results found for: HGBA1C Urine Drug Screen:     Component Value Date/Time   LABOPIA NONE DETECTED 17-Jun-2016 2336   COCAINSCRNUR NONE DETECTED Jun 17, 2016 2336   LABBENZ NONE DETECTED 06/17/16 2336   AMPHETMU NONE DETECTED 17-Jun-2016 2336   THCU NONE DETECTED 2016-06-17 2336   LABBARB NONE DETECTED 17-Jun-2016 2336      IMAGING  Ct Angio Head and Neck W Or Wo Contrast 05/30/2016 1. Abrupt occlusion of the proximal left P2 segment.  2. No other large or proximal arterial branch occlusion identified.  3. Short-segment severe right A2 stenosis.  4. Multifocal atheromatous irregularity involving the distal MCA branches bilaterally.  5. Short-segment atheromatous stenosis of approximately 35% at the proximal left ICA.  Ct Head Wo Contrast 05/30/2016 1. Similar appearance of evolving left MCA territory infarcts involving the left basal ganglia and probable anterior left frontal lobe.  2. Subtle loss of gray-white matter differentiation within the parasagittal left tempo-occipital region, suspicious for evolving left PCA territory infarct. This is compatible with previously identified left P2 occlusion.  3. No other new acute intracranial process identified.     Ct Head Code Stroke W/o Cm 06/05/2016 1. Acute evolving left basal ganglia infarct as above, with additional possible  patchy involvement within the supra ganglionic anterior left frontal lobe.  2. ASPECTS is 6  3. Remote right basal ganglia infarct.  4. Mild chronic small vessel ischemic disease.    MRI Brain W/o Contrast - pending    PHYSICAL EXAM  Obese middle aged male not in distress. . Afebrile. Head is nontraumatic. Neck is supple without bruit.    Cardiac exam no murmur or gallop. Lungs are clear to auscultation. Distal pulses are well felt.  Neurological Exam :  Slightly drowsy but arouses easily. Right pupil is slightly larger 4 mm left is 3 mm but both are reactive. There is no ptosis or ophthalmoplegia. Blinks to threat more on the left compared to the right. Fundi were not visualized. Patient has a nonfluent speech with word finding difficulties and comprehension also appears to be impaired. Can follow only simple midline and one-step commands. Right hemiplegia with grade 1/5 strength in the right upper extremity with tapered to been on his fingers. Grade 2/5 strength in the right lower extremity. Purposeful antigravity strength on the left without weakness. Sensation appears diminished on the right compared to the left. Right plantar is upgoing left is downgoing. Reflexes are depressed on the right. Gait was not tested.    ASSESSMENT/PLAN [name] is a 61 y.o. male with history of hypertension, depression, new onset atrial fibrillation without anticoagulation, hyperglycemia, and obesity presenting with dysarthria with right sided weakness.  He received IV t-PA Monday, 05/27/2016, at 2330.   Stroke:  Dominant infarct embolic secondary to atrial fibrillation.  Resultant  Right hemiparesis and field cut  MRI - pending  MRA - not performed  CTA H&N - Abrupt occlusion of the proximal left P2 segment. Short-segment severe right A2 stenosis.  Carotid Doppler - CTA neck 2D Echo - - Left ventricle: The cavity size was normal. Wall thickness was   increased in a pattern of moderate  LVH. Systolic function was   normal. The estimated ejection fraction was in the range of 55%    to 60%. Wall motion was normal;   LDL - 106  HgbA1c - pending  VTE prophylaxis - SCDs Diet NPO time specified  No antithrombotic prior to admission, now on No antithrombotic secondary to t-PA therapy  Ongoing aggressive stroke risk factor management  Therapy recommendations: pending  Disposition:  Pending  Hypertension  Stable - Cardene drip  Permissive hypertension (OK if < 220/120) but gradually normalize in 5-7 days  Long-term BP goal normotensive   Hyperlipidemia  Home meds:  No lipid lowering medications prior to admission.  LDL 106, goal < 70  Add statin when PO access is available  Continue statin at discharge    Other Stroke Risk Factors  Advanced age  ETOH use, advised to drink no more than 1 drink per day  Obesity, Body mass index is 41.34 kg/m., recommend weight loss, diet and exercise as appropriate   Hx stroke/TIA - remote basal ganglia infarct by imaging  New  onset atrial fibrillation  Other Active Problems  New-onset atrial fibrillation - appreciate cardiology consult - will need anticoagulation when appropriate.  Elevated glucose levels  Mild leukocytosis (afebrile)  Short-segment severe right A2 stenosis.     Hospital day # 0  Delton SeeDavid Rinehuls PA-C [facility] Pager [phone] 05/30/2016, 3:00 PM I have personally examined this patient, reviewed notes, independently viewed imaging studies, participated in medical decision making and plan of care.ROS completed by me personally and pertinent positives fully documented  I have made any additions or clarifications directly to the above note. Agree with note above. He presented with right hemiparesis due to left hemispheric posterior cerebral artery infarct from left P2 occlusion likely from new onset atrial fibrillation and received IV tPA. They had fluctuating examined tPA  had to be temporarily stopped but follow-up imaging did not show any post TPA hemorrhage. Continue ongoing close neurological monitoring and strict blood pressure control. Check MRI scan of the brain later today. Cardiology consult for new onset atrial fibrillation.Family not available at the bedside for discussion. This patient is critically ill and at significant risk of neurological worsening, death and care requires constant monitoring of vital signs, hemodynamics,respiratory and cardiac monitoring, extensive review of multiple databases, frequent neurological assessment, discussion with family, other specialists and medical decision making of high complexity.I have made any additions or clarifications directly to the above note.This critical care time does not reflect procedure time, or teaching time or supervisory time of PA/NP/Med Resident etc but could involve care discussion time.  I spent 30 minutes of neurocritical care time  in the care of  this patient.      Delia HeadyPramod Sethi, MD Medical Director [facility] Pager: [phone] 05/30/2016 3:43 PM   To contact Stroke Continuity provider, please refer to [email or web address]. After hours, contact General Neurology

## 2016-05-30 NOTE — Care Management Note (Signed)
Case Management Note  Patient Details  Name: JUVENCIO VERDI MRN: [id number] Date of Birth: 1955/08/12  Subjective/Objective:  Pt admitted on 05/10/2016 s/p Lt MCA and PCA infarcts.  PTA, pt independent, lives with son, Quita Skye, at bedside.  Pt is self-employed as a Psychologist, sport and exercise.                  Action/Plan: Met with son and pt's sister to discuss discharge planning.  Family states they can assist with care at discharge.  Will follow for discharge planning as pt progresses.  PT/OT evaluations pending.   Expected Discharge Date:                  Expected Discharge Plan:  [place]  In-House Referral:     Discharge planning Services  CM Consult  Post Acute Care Choice:    Choice offered to:     DME Arranged:    DME Agency:     HH Arranged:    [place] Agency:     Status of Service:  In process, will continue to follow  If discussed at Long Length of Stay Meetings, dates discussed:    Additional Comments:  Reinaldo Raddle, RN, BSN  Trauma/Neuro ICU Case Manager [phone]

## 2016-05-30 NOTE — Evaluation (Signed)
Clinical/Bedside Swallow Evaluation Patient Details  Name: Ryan Anderson MRN: [id number] Date of Birth: 08/27/1955  Today's Date: 05/30/2016 Time: SLP Start Time (ACUTE ONLY): 1055 SLP Stop Time (ACUTE ONLY): 1105 SLP Time Calculation (min) (ACUTE ONLY): 10 min  Past Medical History:  Past Medical History:  Diagnosis Date  . Brachial neuritis    Right-RESOLVED-([place])  . Depressive disorder, not elsewhere classified    prev treated with celexa after wife died 09-27-08  . Disturbance of skin sensation   . History of nephrolithiasis   . Hypogonadism male    BORDERLINE  . Insomnia, unspecified   . Obesity, unspecified   . Other chest pain   . Routine general medical examination at a health care facility   . Screening examination for venereal disease   . Unspecified essential hypertension    Past Surgical History:  Past Surgical History:  Procedure Laterality Date  . TONSILLECTOMY AND ADENOIDECTOMY  1962   HPI:  61 y.o.malewith a history of hypertension and obesity, brought to the ED with acute onset right-sided weakness and slurred speech. CT showed what appeared to be an evolcing left basal ganglia ischemic infarction. Pt had worsening of his speech and onset of expressive aphasia during tPA administration, but no signs of acute hemorrhage were noted on repeat CT, therefore tPA was resumed.   Assessment / Plan / Recommendation Clinical Impression  Pt is drowsy and needs minimal stimulation for arousal, but requires Mod cues to maintain an adequate level of alertness. He has good oral acceptance and swift oral preparation of ice chips, but he has immediate and delayed coughing after he swallows. Recommend to remain NPO for now. Will f/u for additional PO trials as his alertness improves.    Aspiration Risk  Moderate aspiration risk    Diet Recommendation NPO   Medication Administration: Via alternative means    Other  Recommendations Oral Care Recommendations: Oral care  QID Other Recommendations: Have oral suction available   Follow up Recommendations  (tba)      Frequency and Duration min 2x/week  2 weeks       Prognosis Prognosis for Safe Diet Advancement: Good Barriers to Reach Goals: Language deficits      Swallow Study   General HPI: 61 y.o.malewith a history of hypertension and obesity, brought to the ED with acute onset right-sided weakness and slurred speech. CT showed what appeared to be an evolcing left basal ganglia ischemic infarction. Pt had worsening of his speech and onset of expressive aphasia during tPA administration, but no signs of acute hemorrhage were noted on repeat CT, therefore tPA was resumed. Type of Study: Bedside Swallow Evaluation Previous Swallow Assessment: none in chart Diet Prior to this Study: NPO Temperature Spikes Noted: No Respiratory Status: Nasal cannula History of Recent Intubation: No Behavior/Cognition: Lethargic/Drowsy;Cooperative;Requires cueing Oral Cavity Assessment: Within Functional Limits Oral Care Completed by SLP: No Self-Feeding Abilities: Needs assist Patient Positioning: Upright in bed Baseline Vocal Quality: Normal Volitional Cough: Strong    Oral/Motor/Sensory Function Overall Oral Motor/Sensory Function: Moderate impairment (difficulty following commands to assess) Facial ROM: Reduced right;Suspected CN VII (facial) dysfunction Facial Symmetry: Abnormal symmetry right;Suspected CN VII (facial) dysfunction   Ice Chips Ice chips: Impaired Presentation: Spoon Pharyngeal Phase Impairments: Cough - Immediate;Cough - Delayed   Thin Liquid Thin Liquid: Not tested    Nectar Thick Nectar Thick Liquid: Not tested   Honey Thick Honey Thick Liquid: Not tested   Puree Puree: Not tested   Solid   GO  Solid: Not tested        Maxcine Hamaiewonsky, Makiah Clauson 05/30/2016,1:24 PM  Maxcine HamLaura Paiewonsky, M.A. CCC-SLP [phone]

## 2016-05-31 ENCOUNTER — Inpatient Hospital Stay (HOSPITAL_COMMUNITY): Payer: Self-pay

## 2016-05-31 DIAGNOSIS — I480 Paroxysmal atrial fibrillation: Secondary | ICD-10-CM

## 2016-05-31 DIAGNOSIS — R4701 Aphasia: Secondary | ICD-10-CM

## 2016-05-31 DIAGNOSIS — G8101 Flaccid hemiplegia affecting right dominant side: Secondary | ICD-10-CM

## 2016-05-31 DIAGNOSIS — I4891 Unspecified atrial fibrillation: Secondary | ICD-10-CM

## 2016-05-31 DIAGNOSIS — I63412 Cerebral infarction due to embolism of left middle cerebral artery: Secondary | ICD-10-CM

## 2016-05-31 DIAGNOSIS — I5041 Acute combined systolic (congestive) and diastolic (congestive) heart failure: Secondary | ICD-10-CM

## 2016-05-31 DIAGNOSIS — N179 Acute kidney failure, unspecified: Secondary | ICD-10-CM

## 2016-05-31 DIAGNOSIS — I63432 Cerebral infarction due to embolism of left posterior cerebral artery: Principal | ICD-10-CM

## 2016-05-31 DIAGNOSIS — I5031 Acute diastolic (congestive) heart failure: Secondary | ICD-10-CM

## 2016-05-31 DIAGNOSIS — D72829 Elevated white blood cell count, unspecified: Secondary | ICD-10-CM

## 2016-05-31 DIAGNOSIS — I10 Essential (primary) hypertension: Secondary | ICD-10-CM

## 2016-05-31 DIAGNOSIS — I6340 Cerebral infarction due to embolism of unspecified cerebral artery: Secondary | ICD-10-CM

## 2016-05-31 LAB — GLUCOSE, CAPILLARY
GLUCOSE-CAPILLARY: 191 mg/dL — AB (ref 65–99)
GLUCOSE-CAPILLARY: 198 mg/dL — AB (ref 65–99)
GLUCOSE-CAPILLARY: 205 mg/dL — AB (ref 65–99)
GLUCOSE-CAPILLARY: 217 mg/dL — AB (ref 65–99)
Glucose-Capillary: 192 mg/dL — ABNORMAL HIGH (ref 65–99)
Glucose-Capillary: 208 mg/dL — ABNORMAL HIGH (ref 65–99)

## 2016-05-31 LAB — HEMOGLOBIN A1C
Hgb A1c MFr Bld: 8.8 % — ABNORMAL HIGH (ref 4.8–5.6)
Mean Plasma Glucose: 206 mg/dL

## 2016-05-31 MED ORDER — JEVITY 1.2 CAL PO LIQD
1000.0000 mL | ORAL | Status: DC
  Administered 2016-05-31: 1000 mL
  Filled 2016-05-31 (×3): qty 1000

## 2016-05-31 MED ORDER — PANTOPRAZOLE SODIUM 40 MG PO PACK
40.0000 mg | PACK | Freq: Every day | ORAL | Status: DC
Start: 2016-06-01 — End: 2016-06-07
  Administered 2016-06-02 – 2016-06-07 (×6): 40 mg
  Filled 2016-05-31 (×6): qty 20

## 2016-05-31 MED ORDER — METOPROLOL TARTRATE 25 MG PO TABS
25.0000 mg | ORAL_TABLET | Freq: Two times a day (BID) | ORAL | Status: DC
  Administered 2016-05-31 – 2016-06-03 (×5): 25 mg via ORAL
  Filled 2016-05-31 (×6): qty 1

## 2016-05-31 MED ORDER — ASPIRIN 325 MG PO TABS
325.0000 mg | ORAL_TABLET | Freq: Every day | ORAL | Status: DC
  Administered 2016-06-02 – 2016-06-07 (×6): 325 mg
  Filled 2016-05-31 (×6): qty 1

## 2016-05-31 MED ORDER — LABETALOL HCL 5 MG/ML IV SOLN
20.0000 mg | INTRAVENOUS | Status: DC | PRN
Start: 2016-05-31 — End: 2016-06-07
  Administered 2016-06-01 – 2016-06-07 (×12): 20 mg via INTRAVENOUS
  Filled 2016-05-31 (×12): qty 4

## 2016-05-31 MED ORDER — PANTOPRAZOLE SODIUM 40 MG PO PACK: 40.0000 mg | PACK | Freq: Every day | ORAL | Status: DC

## 2016-05-31 MED ORDER — INSULIN ASPART 100 UNIT/ML ~~LOC~~ SOLN
2.0000 [IU] | SUBCUTANEOUS | Status: DC
  Administered 2016-05-31: 6 [IU] via SUBCUTANEOUS
  Administered 2016-05-31: 4 [IU] via SUBCUTANEOUS
  Administered 2016-05-31: 6 [IU] via SUBCUTANEOUS
  Administered 2016-06-01: 2 [IU] via SUBCUTANEOUS
  Administered 2016-06-01: 4 [IU] via SUBCUTANEOUS
  Administered 2016-06-01: 6 [IU] via SUBCUTANEOUS
  Administered 2016-06-01 (×3): 4 [IU] via SUBCUTANEOUS
  Administered 2016-06-02: 2 [IU] via SUBCUTANEOUS
  Administered 2016-06-02: 4 [IU] via SUBCUTANEOUS
  Administered 2016-06-02 (×3): 2 [IU] via SUBCUTANEOUS
  Administered 2016-06-03 (×2): 4 [IU] via SUBCUTANEOUS
  Administered 2016-06-03: 6 [IU] via SUBCUTANEOUS
  Administered 2016-06-03 – 2016-06-04 (×5): 4 [IU] via SUBCUTANEOUS
  Administered 2016-06-04: 6 [IU] via SUBCUTANEOUS
  Administered 2016-06-04: 4 [IU] via SUBCUTANEOUS
  Administered 2016-06-04 – 2016-06-05 (×3): 6 [IU] via SUBCUTANEOUS
  Administered 2016-06-05: 4 [IU] via SUBCUTANEOUS
  Administered 2016-06-05: 6 [IU] via SUBCUTANEOUS
  Administered 2016-06-05 – 2016-06-06 (×3): 4 [IU] via SUBCUTANEOUS
  Administered 2016-06-06 (×3): 6 [IU] via SUBCUTANEOUS
  Administered 2016-06-06: 4 [IU] via SUBCUTANEOUS
  Administered 2016-06-06 – 2016-06-07 (×5): 6 [IU] via SUBCUTANEOUS

## 2016-05-31 NOTE — Progress Notes (Signed)
Speech Language Pathology Treatment: Dysphagia;Cognitive-Linquistic  Patient Details Name: Ryan Anderson MRN: [id number] DOB: [birth year] Today's Date: 05/31/2016 Time: 1001-1013 SLP Time Calculation (min) (ACUTE ONLY): 12 min  Assessment / Plan / Recommendation Clinical Impression  Pt is somewhat more alert this morning and attempting to communicate a little more as well. Pt completed automatic speech tasks with Min cues for initiation, including counting (1-10), says of the week, and months of the year. He has no overt coughing with ice chips and spoonfuls of water, but larger sips still elicit coughing. Pt has no overt s/s of aspiration with the first several bites of pureed solids, but then he starts to exhibit delayed throat clearing. This may be indicative of pharyngeal residue. Recommend to remain NPO except for meds crushed in puree. Will continue to follow for readiness for PO diet versus need for objective testing.    HPI HPI: 61 y.o.malewith a history of hypertension and obesity, brought to the ED with acute onset right-sided weakness and slurred speech. CT showed what appeared to be an evolcing left basal ganglia ischemic infarction. Pt had worsening of his speech and onset of expressive aphasia during tPA administration, but no signs of acute hemorrhage were noted on repeat CT, therefore tPA was resumed.      SLP Plan  Continue with current plan of care     Recommendations  Diet recommendations: NPO Medication Administration: Crushed with puree                Oral Care Recommendations: Oral care QID Follow up Recommendations: Inpatient Rehab Plan: Continue with current plan of care       GO                Ryan Anderson, Ryan Anderson 05/31/2016, 10:55 AM  Ryan Anderson, M.A. CCC-SLP [phone]

## 2016-05-31 NOTE — Progress Notes (Signed)
STROKE TEAM PROGRESS NOTE   HISTORY OF PRESENT ILLNESS (per record) Ryan Anderson is an 61 y.o. male with a history of hypertension and obesity, brought to the ED in code stroke status following acute onset of weakness involving right face and extremities along with slurred speech. Onset was at 9:45 PM. He has no previous history of stroke nor TIA. He was found to be in atrial fibrillation with RVR which had not previously been recognized. His blood pressure was markedly elevated and he had hyperglycemia in the 200s. 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 CT angiogram showed proximal left P2 PCA occlusion. There was no MCA occlusion. Patient was deemed a candidate for TPA which was administered after getting his blood pressure controlled which required Cardene drip after labetalol proved to be ineffective. There was worsening of his speech with onset of expressive aphasia during TPA infusion. TPA was suspended and repeat CT scan of the head was obtained which showed no signs of acute hemorrhage. TPA was resumed. Patient was not deemed a candidate for IR intervention, and was admitted to neuro ICU for further management.  LSN: 9:45 PM on 05/09/2016 tPA Given: Yes. tPA was delayed because of effort required to manage patient's hypertensive urgency. mRankin:   SUBJECTIVE (INTERVAL HISTORY) The patient's RN is the bedside. The patient was lethargic and aphasic. Right hemiparesis persists OBJECTIVE Temp:  [97.9 F (36.6 C)-99 F (37.2 C)] 99 F (37.2 C) (01/24 1200) Pulse Rate:  [52-133] 66 (01/24 1300) Cardiac Rhythm: Normal sinus rhythm (01/24 1300) Resp:  [12-23] 16 (01/24 1300) BP: (125-190)/(66-166) 160/93 (01/24 1300) SpO2:  [91 %-98 %] 95 % (01/24 1300)  CBC:   Recent Labs Lab 05/14/2016 2315 05/22/2016 2323  WBC 11.4*  --   NEUTROABS 8.1*  --   HGB 16.9 17.0  HCT 47.6 50.0   MCV 90.0  --   PLT 225  --     Basic Metabolic Panel:   Recent Labs Lab 05/25/2016 2315 05/28/2016 2323  NA 138 140  K 3.7 3.5  CL 101 100*  CO2 25  --   GLUCOSE 235* 237*  BUN 17 21*  CREATININE 1.26* 1.20  CALCIUM 9.4  --     Lipid Panel:     Component Value Date/Time   CHOL 159 05/30/2016 0340   TRIG 58 05/30/2016 0340   HDL 41 05/30/2016 0340   CHOLHDL 3.9 05/30/2016 0340   VLDL 12 05/30/2016 0340   LDLCALC 106 (H) 05/30/2016 0340   HgbA1c:  Lab Results  Component Value Date   HGBA1C 8.8 (H) 05/30/2016   Urine Drug Screen:     Component Value Date/Time   LABOPIA NONE DETECTED 05/31/2016 2336   COCAINSCRNUR NONE DETECTED 05/15/2016 2336   LABBENZ NONE DETECTED 06/06/2016 2336   AMPHETMU NONE DETECTED 06/04/2016 2336   THCU NONE DETECTED 05/16/2016 2336   LABBARB NONE DETECTED 05/27/2016 2336      IMAGING  Ct Angio Head and Neck W Or Wo Contrast 05/30/2016 1. Abrupt occlusion of the proximal left P2 segment.  2. No other large or proximal arterial branch occlusion identified.  3. Short-segment severe right A2 stenosis.  4. Multifocal atheromatous irregularity involving the distal MCA branches bilaterally.  5. Short-segment atheromatous stenosis of approximately 35% at the proximal left ICA.     Ct Head Wo Contrast 05/30/2016 1. Similar appearance of evolving left  MCA territory infarcts involving the left basal ganglia and probable anterior left frontal lobe.  2. Subtle loss of gray-white matter differentiation within the parasagittal left tempo-occipital region, suspicious for evolving left PCA territory infarct. This is compatible with previously identified left P2 occlusion.  3. No other new acute intracranial process identified.     Ct Head Code Stroke W/o Cm Jun 08, 2016 1. Acute evolving left basal ganglia infarct as above, with additional possible patchy involvement within the supra ganglionic anterior left frontal lobe.  2. ASPECTS is 6  3.  Remote right basal ganglia infarct.  4. Mild chronic small vessel ischemic disease.    MRI Brain W/o Contrast - pending    PHYSICAL EXAM  Obese middle aged male not in distress. . Afebrile. Head is nontraumatic. Neck is supple without bruit.    Cardiac exam no murmur or gallop. Lungs are clear to auscultation. Distal pulses are well felt.  Neurological Exam :  Slightly drowsy but arouses easily. Right pupil is slightly larger 4 mm left is 3 mm but both are reactive. There is no ptosis or ophthalmoplegia. Blinks to threat more on the left compared to the right. Fundi were not visualized. Patient has a nonfluent speech with word finding difficulties and speaks only a few words and comprehension also appears to be impaired. Can follow only simple midline and one-step commands. Right hemiplegia with grade 1/5 strength in the right upper extremity with tapered to been on his fingers. Grade 2/5 strength in the right lower extremity. Purposeful antigravity strength on the left without weakness. Sensation appears diminished on the right compared to the left. Right plantar is upgoing left is downgoing. Reflexes are depressed on the right. Gait was not tested.    ASSESSMENT/PLAN Mr. JEFTE CARITHERS is a 61 y.o. male with history of hypertension, depression, new onset atrial fibrillation without anticoagulation, hyperglycemia, and obesity presenting with dysarthria with right sided weakness.  He received IV t-PA Monday, 2016/06/08, at 2330.   Stroke:  Dominant infarct embolic secondary to atrial fibrillation.  Resultant  Right hemiparesis and field cut  MRI - motion degraded. Large left PCA infarct and subacute left MCA and basal ganglia and frontal lobe infarcts. Old right basal ganglia infarct. MRA - not performed  CTA H&N - Abrupt occlusion of the proximal left P2 segment. Short-segment severe right A2 stenosis.  Carotid Doppler - see  CTA neck 2D Echo - - Left ventricle: The cavity size was  normal. Wall thickness was   increased in a pattern of moderate LVH. Systolic function was   normal. The estimated ejection fraction was in the range of 55%    to 60%. Wall motion was normal;   LDL - 106  HgbA1c - 8.8   VTE prophylaxis - SCDs Diet NPO time specified  No antithrombotic prior to admission, now on No antithrombotic secondary to t-PA therapy  Ongoing aggressive stroke risk factor management  Therapy recommendations: pending  Disposition:  Pending  Hypertension  Stable - Cardene drip  Permissive hypertension (OK if < 220/120) but gradually normalize in 5-7 days  Long-term BP goal normotensive   Hyperlipidemia  Home meds:  No lipid lowering medications prior to admission.  LDL 106, goal < 70  Add statin when PO access is available  Continue statin at discharge    Other Stroke Risk Factors  Advanced age  ETOH use, advised to drink no more than 1 drink per day  Obesity, Body mass index is 41.34 kg/m., recommend weight loss,  diet and exercise as appropriate   Hx stroke/TIA - remote basal ganglia infarct by imaging  New onset atrial fibrillation  Other Active Problems  New-onset atrial fibrillation - appreciate cardiology consult - will need anticoagulation when appropriate.  Elevated glucose levels  Mild leukocytosis (afebrile)  Short-segment severe right A2 stenosis.     Hospital day # 1  Delton See PA-C [facility] Pager [phone] 05/31/2016, 1:31 PM I have personally examined this patient, reviewed notes, independently viewed imaging studies, participated in medical decision making and plan of care.ROS completed by me personally and pertinent positives fully documented  I have made any additions or clarifications directly to the above note. Agree with note above. He presented with right hemiparesis due to left hemispheric posterior cerebral artery infarct from left P2 occlusion likely from new onset atrial  fibrillation and received IV tPA. They had fluctuating examined tPA had to be temporarily stopped but follow-up imaging did not show any post TPA hemorrhage. Continue ongoing close neurological monitoring and strict blood pressure control. Appreciate Cardiology consult for new onset atrial fibrillation.I spoke to sister over the phone and gave update and answered questions.plan to taper Cardene drip and start oral medications and nutrition if able to swallow otherwise maintained up and active.  This patient is critically ill and at significant risk of neurological worsening, death and care requires constant monitoring of vital signs, hemodynamics,respiratory and cardiac monitoring, extensive review of multiple databases, frequent neurological assessment, discussion with family, other specialists and medical decision making of high complexity.I have made any additions or clarifications directly to the above note.This critical care time does not reflect procedure time, or teaching time or supervisory time of PA/NP/Med Resident etc but could involve care discussion time.  I spent 30 minutes of neurocritical care time  in the care of  this patient.      Delia Heady, MD Medical Director [facility] Pager: [phone] 05/31/2016 1:31 PM   To contact Stroke Continuity provider, please refer to [email or web address]. After hours, contact General Neurology

## 2016-05-31 NOTE — Progress Notes (Signed)
I will follow up tomorrow to assess caregiver support which is anticipated after an inpt rehab admission and bed availability with [facility]. [phone]

## 2016-05-31 NOTE — Progress Notes (Signed)
~  2345: Noted continued elevated BP. Text'd paged MD to verify/clarify BP meds. ~0000: No call back, Cardene restarted. Will continue to monitor.

## 2016-05-31 NOTE — Progress Notes (Signed)
Progress Note  Patient Name: Ryan Anderson Date of Encounter: 05/31/2016  Primary Cardiologist: New. Dr. [place]  Subjective   Patient denies any chest pain or SOB.   Inpatient Medications    Scheduled Meds: . aspirin  300 mg Rectal Daily  . insulin aspart  0-9 Units Subcutaneous Q4H  . pantoprazole (PROTONIX) IV  40 mg Intravenous QHS   Continuous Infusions: . sodium chloride 75 mL/hr at 05/31/16 1041  . niCARDipine Stopped (05/31/16 1019)   PRN Meds: acetaminophen **OR** acetaminophen (TYLENOL) oral liquid 160 mg/5 mL **OR** acetaminophen, labetalol, metoprolol   Vital Signs    Vitals:   05/31/16 1000 05/31/16 1015 05/31/16 1030 05/31/16 1045  BP: (!) 176/95 (!) 167/88 (!) 171/84 (!) 165/92  Pulse: 83 94 71 73  Resp: 19 (!) 22 18 18   Temp:      TempSrc:      SpO2: 95% 96% 93% 94%  Weight:      Height:        Intake/Output Summary (Last 24 hours) at 05/31/16 1112 Last data filed at 05/31/16 1019  Gross per 24 hour  Intake          1909.59 ml  Output             1950 ml  Net           -40.41 ml   Filed Weights   05/19/2016 2342  Weight: 279 lb 1.6 oz (126.6 kg)    Telemetry    Recurrent Afib with RVR during the night. Now back in NSR - Personally Reviewed  ECG    05/30/16: NSR with diffuse T wave inversion - Personally Reviewed  Physical Exam    GEN: No acute distress.  Neck: No JVD Cardiac: RRR, no murmurs, rubs, or gallops.  Respiratory: Clear to auscultation bilaterally. GI: Soft, nontender, non-distended  MS: No edema; No deformity. Neuro:  aphasic, difficulty with speech. Right hemiplegia.   Labs    Chemistry Recent Labs Lab 06/06/2016 2315 05/28/2016 2323  NA 138 140  K 3.7 3.5  CL 101 100*  CO2 25  --   GLUCOSE 235* 237*  BUN 17 21*  CREATININE 1.26* 1.20  CALCIUM 9.4  --   PROT 7.3  --   ALBUMIN 3.7  --   AST 37  --   ALT 31  --   ALKPHOS 103  --   BILITOT 0.7  --   GFRNONAA 59*  --   GFRAA >60  --   ANIONGAP 12  --      Hematology Recent Labs Lab 05/09/2016 2315 05/10/2016 2323  WBC 11.4*  --   RBC 5.29  --   HGB 16.9 17.0  HCT 47.6 50.0  MCV 90.0  --   MCH 31.9  --   MCHC 35.5  --   RDW 12.7  --   PLT 225  --     Cardiac EnzymesNo results for input(s): TROPONINI in the last 168 hours.  Recent Labs Lab 05/28/2016 2321  TROPIPOC 0.03     BNPNo results for input(s): BNP, PROBNP in the last 168 hours.   DDimer No results for input(s): DDIMER in the last 168 hours.   Radiology    Ct Angio Head W Or Wo Contrast  Result Date: 05/30/2016 CLINICAL DATA:  Initial evaluation for acute right-sided weakness. EXAM: CT ANGIOGRAPHY HEAD AND NECK TECHNIQUE: Multidetector CT imaging of the head and neck was performed using the standard protocol during bolus administration  of intravenous contrast. Multiplanar CT image reconstructions and MIPs were obtained to evaluate the vascular anatomy. Carotid stenosis measurements (when applicable) are obtained utilizing NASCET criteria, using the distal internal carotid diameter as the denominator. CONTRAST:  50 cc of Isovue 370. COMPARISON:  Prior CT from earlier the same day. FINDINGS: CTA NECK FINDINGS Aortic arch: Visualized aortic arch of normal caliber with normal branch pattern. No high-grade stenosis at the origin of the great vessels. Visualized subclavian arteries widely patent. Right carotid system: Right common carotid artery patent from its origin to the bifurcation. Minimal atheromatous plaque about the right bifurcation without stenosis. Right ICA widely patent to the skullbase without stenosis, dissection, or occlusion. Left carotid system: Left common carotid artery widely patent from its origin to the bifurcation. Atheromatous plaque about the left bifurcation/proximal left ICA. Short-segment narrowing of approximately 35% by NASCET criteria. Distally left ICA widely patent to the skullbase without stenosis, dissection, or occlusion. Vertebral arteries: Both of  the vertebral arteries arise from the subclavian arteries. Left vertebral artery dominant. Vertebral arteries widely patent within the neck without stenosis, dissection, or occlusion. Skeleton: Moderate multilevel degenerative spondylolysis within the cervical spine. No acute osseous abnormality. No worrisome lytic or blastic osseous lesions. Other neck: Soft tissues of the neck demonstrate no acute abnormality. No adenopathy. Upper chest: Visualized upper mediastinum within normal limits. Visualized lungs are grossly clear. The scattered calcified granulomas noted. Review of the MIP images confirms the above findings CTA HEAD FINDINGS Anterior circulation: Petrous segments widely patent bilaterally. Minimal atheromatous plaque within the cavernous ICAs without significant stenosis. Supraclinoid segments widely patent. A1 segments patent. Anterior communicating artery not well visualized, and may be hypoplastic/absent. Short-segment severe right A2 stenosis noted (series 7, image 91). Mild atheromatous irregularity within the left ACA. ACA is are patent to their distal aspects. M1 segments patent without stenosis or occlusion. MCA bifurcations normal. No proximal M2 occlusion. Distal MCA branches opacified and symmetric. Multifocal atheromatous irregularity throughout the distal MCA branches bilaterally. Posterior circulation: Vertebral arteries patent to the vertebrobasilar junction without stenosis. Left vertebral artery dominant. Posterior inferior cerebral arteries patent bilaterally. Basilar artery widely patent. Superior cerebellar arteries patent bilaterally. Posterior cerebral artery supplied via the basilar artery. Right PCA demonstrates multifocal atheromatous irregularity but is patent to its distal aspect. There is occlusion of the proximal left P 2 segment (Series 8, image 106). Absent flow distally. Venous sinuses: Patent. Anatomic variants: No significant anatomic variant. No aneurysm or vascular  malformation. Delayed phase: Not performed. Review of the MIP images confirms the above findings IMPRESSION: 1. Abrupt occlusion of the proximal left P2 segment. 2. No other large or proximal arterial branch occlusion identified. 3. Short-segment severe right A2 stenosis. 4. Multifocal atheromatous irregularity involving the distal MCA branches bilaterally. 5. Short-segment atheromatous stenosis of approximately 35% at the proximal left ICA. Critical Value/emergent results were called by telephone at the time of interpretation on 05/30/2016 at 12:15 am to Dr. Noel Christmas , who verbally acknowledged these results. In Electronically Signed   By: Rise Mu M.D.   On: 05/30/2016 00:23   Ct Head Wo Contrast  Result Date: 05/30/2016 CLINICAL DATA:  Initial evaluation for new onset aphasia, previous code stroke. EXAM: CT HEAD WITHOUT CONTRAST TECHNIQUE: Contiguous axial images were obtained from the base of the skull through the vertex without intravenous contrast. COMPARISON:  Prior CT and CTA from earlier the same day. FINDINGS: Brain: Again seen is an evolving acute ischemic infarct involving the left basal ganglia, with involvement  of the left caudate and lentiform nucleus as well as the anterior aspect of the left internal capsule. No significant mass effect. Additional patchy hypodensity more superiorly within the left frontal lobe again seen as well, also concerning for small foci of evolving ischemia. These are overall relatively similar to previous. There is subtle loss of gray-white matter differentiation within the parasagittal left occipital lobe with extension into the mesial left temporal lobe, concerning for evolving ischemia (series 2, image 15). This is consistent with previously identified left P2 occlusion. No significant mass effect. No other definite evidence for acute large vessel territory infarct. Gray-white matter differentiation otherwise maintained. No definite acute  intracranial hemorrhage, although evaluation mildly limited as IV contrast is on board. Probable minimal patchy enhancement about the left frontal lobe infarcts noted. Remote right basal ganglia infarct again noted. No mass lesion, midline shift, or mass effect. Ventricle stable in size without evidence for hydrocephalus. No extra-axial fluid collection. Vascular: IV contrast throughout the intracranial vasculature related to recent CTA. Skull: Scalp soft tissues within normal limits.  Calvarium intact. Sinuses/Orbits: Globes and orbits demonstrate no acute abnormality. Patient gaze is directed to the left. Moderate paranasal sinus disease again noted. Mastoids remain largely clear. IMPRESSION: 1. Similar appearance of evolving left MCA territory infarcts involving the left basal ganglia and probable anterior left frontal lobe. 2. Subtle loss of gray-white matter differentiation within the parasagittal left tempo-occipital region, suspicious for evolving left PCA territory infarct. This is compatible with previously identified left P2 occlusion. 3. No other new acute intracranial process identified. Electronically Signed   By: Rise Mu M.D.   On: 05/30/2016 01:24   Ct Angio Neck W Or Wo Contrast  Result Date: 05/30/2016 CLINICAL DATA:  Initial evaluation for acute right-sided weakness. EXAM: CT ANGIOGRAPHY HEAD AND NECK TECHNIQUE: Multidetector CT imaging of the head and neck was performed using the standard protocol during bolus administration of intravenous contrast. Multiplanar CT image reconstructions and MIPs were obtained to evaluate the vascular anatomy. Carotid stenosis measurements (when applicable) are obtained utilizing NASCET criteria, using the distal internal carotid diameter as the denominator. CONTRAST:  50 cc of Isovue 370. COMPARISON:  Prior CT from earlier the same day. FINDINGS: CTA NECK FINDINGS Aortic arch: Visualized aortic arch of normal caliber with normal branch pattern. No  high-grade stenosis at the origin of the great vessels. Visualized subclavian arteries widely patent. Right carotid system: Right common carotid artery patent from its origin to the bifurcation. Minimal atheromatous plaque about the right bifurcation without stenosis. Right ICA widely patent to the skullbase without stenosis, dissection, or occlusion. Left carotid system: Left common carotid artery widely patent from its origin to the bifurcation. Atheromatous plaque about the left bifurcation/proximal left ICA. Short-segment narrowing of approximately 35% by NASCET criteria. Distally left ICA widely patent to the skullbase without stenosis, dissection, or occlusion. Vertebral arteries: Both of the vertebral arteries arise from the subclavian arteries. Left vertebral artery dominant. Vertebral arteries widely patent within the neck without stenosis, dissection, or occlusion. Skeleton: Moderate multilevel degenerative spondylolysis within the cervical spine. No acute osseous abnormality. No worrisome lytic or blastic osseous lesions. Other neck: Soft tissues of the neck demonstrate no acute abnormality. No adenopathy. Upper chest: Visualized upper mediastinum within normal limits. Visualized lungs are grossly clear. The scattered calcified granulomas noted. Review of the MIP images confirms the above findings CTA HEAD FINDINGS Anterior circulation: Petrous segments widely patent bilaterally. Minimal atheromatous plaque within the cavernous ICAs without significant stenosis. Supraclinoid segments widely  patent. A1 segments patent. Anterior communicating artery not well visualized, and may be hypoplastic/absent. Short-segment severe right A2 stenosis noted (series 7, image 91). Mild atheromatous irregularity within the left ACA. ACA is are patent to their distal aspects. M1 segments patent without stenosis or occlusion. MCA bifurcations normal. No proximal M2 occlusion. Distal MCA branches opacified and symmetric.  Multifocal atheromatous irregularity throughout the distal MCA branches bilaterally. Posterior circulation: Vertebral arteries patent to the vertebrobasilar junction without stenosis. Left vertebral artery dominant. Posterior inferior cerebral arteries patent bilaterally. Basilar artery widely patent. Superior cerebellar arteries patent bilaterally. Posterior cerebral artery supplied via the basilar artery. Right PCA demonstrates multifocal atheromatous irregularity but is patent to its distal aspect. There is occlusion of the proximal left P 2 segment (Series 8, image 106). Absent flow distally. Venous sinuses: Patent. Anatomic variants: No significant anatomic variant. No aneurysm or vascular malformation. Delayed phase: Not performed. Review of the MIP images confirms the above findings IMPRESSION: 1. Abrupt occlusion of the proximal left P2 segment. 2. No other large or proximal arterial branch occlusion identified. 3. Short-segment severe right A2 stenosis. 4. Multifocal atheromatous irregularity involving the distal MCA branches bilaterally. 5. Short-segment atheromatous stenosis of approximately 35% at the proximal left ICA. Critical Value/emergent results were called by telephone at the time of interpretation on 05/30/2016 at 12:15 am to Dr. Noel Christmas , who verbally acknowledged these results. In Electronically Signed   By: Rise Mu M.D.   On: 05/30/2016 00:23   Mr Brain Wo Contrast  Result Date: 05/30/2016 CLINICAL DATA:  Acute onset RIGHT face and extremity weakness, slurred speech. Follow-up multi territorial infarcts, known LEFT P2 occlusion. History of hypertension. EXAM: MRI HEAD WITHOUT CONTRAST TECHNIQUE: Multiplanar, multiecho pulse sequences of the brain and surrounding structures were obtained without intravenous contrast. COMPARISON:  CT HEAD May 30, 2016 at 12:35 a.m. and CT HEAD May 29, 2016 FINDINGS: Multiple sequences are moderately motion degraded. BRAIN:  Confluent reduced diffusion LEFT mesial temporal occipital lobe and posterior LEFT thalamus with low ADC value. Patchy susceptibility artifact LEFT thalamus. Minimal reduced diffusion and T2 bright signal LEFT anterior basal ganglia and LEFT frontal lobe with scattered superimposed LEFT frontal lobe susceptibility artifact. Old RIGHT basal ganglia cystic lacunar infarct with mineralization. Mild ex vacuo dilatation subjacent ventricle, mild to moderate ventriculomegaly on the basis of global parenchymal brain volume loss. Additional scattered subcentimeter supratentorial subcentimeter supratentorial white matter FLAIR T2 hyperintensities. No abnormal extra-axial fluid collections. VASCULAR: Normal major intracranial vascular flow voids present at skull base. SKULL AND UPPER CERVICAL SPINE: No abnormal sellar expansion. No suspicious calvarial bone marrow signal. Craniocervical junction maintained. SINUSES/ORBITS: Small bilateral mastoid effusions. Moderate to severe paranasal sinusitis. The included ocular globes and orbital contents are non-suspicious. OTHER: None. IMPRESSION: 1. Moderately motion degraded examination. Acute large LEFT PCA territory infarct including LEFT thalamus with petechial hemorrhage. 2. Subacute LEFT MCA territory infarct involving LEFT basal ganglia and LEFT frontal lobe with petechial hemorrhage. 3. Old RIGHT basal ganglia infarct. 4. Mild chronic small vessel ischemic disease. Mild to moderate parenchymal brain volume loss. 5. These results will be called to the ordering clinician or representative by the Radiologist Assistant, and communication documented in the [employer] Dashboard Electronically Signed   By: Awilda Metro M.D.   On: 05/30/2016 16:57   Ct Head Code Stroke W/o Cm  Result Date: 05/27/2016 CLINICAL DATA:  Code stroke. Initial evaluation for acute right-sided weakness. EXAM: CT HEAD WITHOUT CONTRAST TECHNIQUE: Contiguous axial images were obtained from the base  of the  skull through the vertex without intravenous contrast. COMPARISON:  None available. FINDINGS: Brain: Age-related cerebral atrophy with mild chronic microvascular ischemic disease. Remote lacunar infarct present within the right basal ganglia. There is evolving hypodensity within the left basal ganglia out, involving the caudate and lentiform nucleus, as well as the anterior limb of the left internal capsule. Additional patchy hypodensity within the anterior left frontal lobe may reflect additional small foci of evolving ischemia (series 2, image 23, 25). No other definite evidence for evolving ischemia. No acute intracranial hemorrhage. No mass lesion, midline shift, or mass effect. No hydrocephalus. No extra-axial fluid collection. Vascular: Vasculature mildly hyper dense in appearance, but is diffusely symmetric without definite hyperdense vessel. Skull: Scalp soft tissues within normal limits.  Calvarium intact. Sinuses/Orbits: Globes and orbits within normal limits. Moderate mucosal thickening within the maxillary sinuses and ethmoidal air cells. Mastoids are clear. A duct Stewart av spine about 5 on a a has an evolving left basal ganglia infarct and al in additional possible patchy involvement within knee at anterior left frontal lobe of the supra ganglionic territory heads with what more on sure a solid at the a half media for PE at this this would be consistent with the left MCA ex it at ages vessels Inderal kind of bright echo hypo seated definite of the well ball pool sleeve with back 6 but in the ASPECTS [facility] Stroke Program Early CT Score) - Ganglionic level infarction (caudate, lentiform nuclei, internal capsule, insula, M1-M3 cortex): 4 - Supraganglionic infarction (M4-M6 cortex): 2 Total score (0-10 with 10 being normal): 6 IMPRESSION: 1. Acute evolving left basal ganglia infarct as above, with additional possible patchy involvement within the supra ganglionic anterior left frontal lobe. 2. ASPECTS  is 6 3. Remote right basal ganglia infarct. 4. Mild chronic small vessel ischemic disease. Critical Value/emergent results were called by telephone at the time of interpretation on 06/17/16 at 11:45 pm to Dr. Roseanne Reno he, who verbally acknowledged these results. Electronically Signed   By: Rise Mu M.D.   On: 2016/06/17 23:46    Cardiac Studies   Echo: 05/30/16: Study Conclusions  - Left ventricle: The cavity size was normal. Wall thickness was   increased in a pattern of moderate LVH. Systolic function was   normal. The estimated ejection fraction was in the range of 55%   to 60%. Wall motion was normal; there were no regional wall   motion abnormalities. Doppler parameters are consistent with   restrictive physiology, indicative of decreased left ventricular   diastolic compliance and/or increased left atrial pressure. - Mitral valve: Calcified annulus. - Left atrium: The atrium was severely dilated.  Impressions:  - Technically difficult; definity used; normal LV systolic   function; moderate LVH; restrictive filling severe LAE.  Patient Profile     61 y.o. male presented to the ED on 2016/06/17 with right sided paralysis, facial droop and slurred speech. 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 CT angiogram showed proximal left P2 PCA occlusion. There was no MCA occlusion.   He was in atrial fibrillation with RVR with rate on 111 on arrival to the ED. He was given TPA (which was paused for a short time as the patient developed aphasia) and transferred to 25M. He has no prior history of atrial fibrillation. He was markedly hypertensive and started on a cardene gtt.  Assessment & Plan  1. Atrial fibrillation with RVR. Recurrent during the night last night. Still hypertensive. Has been cleared to take pills with applesauce. Will start oral metoprolol. Recommend  anticoagulation with NOAC when OK with Neurology. 2. HTN - goal of normotension. Avoid hypotension. Off IV cardene now. Start metoprolol. Follow 3. Diastolic dysfunction by Echo. No overt CHF. Focus on BP management 4. Acute Left PCA and left MCA territory  Infarct c/w embolic event.  5. Hypercholesterolemia. Recommend statin therapy  Signed, Gaspare Netzel [place], MD  05/31/2016, 11:12 AM

## 2016-05-31 NOTE — Progress Notes (Signed)
Rehab Admissions Coordinator Note:  Patient was screened by Trish MageLogue, Oluwatimileyin Vivier M for appropriateness for an Inpatient Acute Rehab Consult.  At this time, we are recommending Inpatient Rehab consult.  Trish MageLogue, Dayrin Stallone M 05/31/2016, 3:14 PM  I can be reached at [phone].

## 2016-05-31 NOTE — Evaluation (Signed)
Physical Therapy Evaluation Patient Details Name: Ryan Anderson MRN: [id number] DOB: [birth year] Today's Date: 05/31/2016   History of Present Illness  Patient is a 61 y/o male with hx of HTN, depressive disorder and obesity presents with acute onset right-sided weakness and slurred speech. CT head- showed what appeared to be an evolcing left basal ganglia ischemic infarction. Pt had worsening of his speech and onset of expressive aphasia during tPA administration, but no signs of acute hemorrhage were noted on repeat CT, therefore tPA was resumed. 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   Clinical Impression  Patient presents with right sided neglect, weakness, global aphasia (expressive>receptive), lethargy, impaired attention, ? visual deficits and impaired mobility s/p above. Tolerated standing with assist of 2 and SPT to/from chair with assist of 2. Pt spontaneously moving right ankle and right wrist but not on command. Able to gaze to right visual field but closes right eye- question diplopia? 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 Would benefit from intensive therapies to maximize independence and mobility prior to return home. Will follow acutely.     Follow Up Recommendations CIR    Equipment Recommendations  Other (comment) (TBA)    Recommendations for Other Services Rehab consult     Precautions / Restrictions Precautions Precautions: Fall Precaution Comments: SBP <180 Restrictions Weight Bearing Restrictions: No      Mobility  Bed Mobility Overal bed mobility: Needs Assistance Bed Mobility: Supine to Sit;Sit to Supine     Supine to sit: Min assist;HOB elevated Sit to supine: Mod assist;HOB elevated   General bed mobility comments: Assist to bring RLE to EOB and minimal assist to elevate trunk, cues for technique. Assist to bring RLE into bed to return to supine. Able to push through bil feet and  adjust hips in bed with RLE stabilized.   Transfers Overall transfer level: Needs assistance Equipment used: 2 person hand held assist Transfers: Sit to/from [employer] Sit to Stand: Max assist;+2 physical assistance;From elevated surface Stand pivot transfers: Max assist;Mod assist;+2 physical assistance       General transfer comment: Stood from EOB x2 with assist of 2; right knee instability on first attempt. Manual cues for upright posture and hip extension. Heavy right lateral lean and not able to self correct to midline. SPT towards left side bed to/from chair with Max A of 2- more initiation on second transfer with no knee buckling right knee.  Ambulation/Gait                Stairs            Wheelchair Mobility    Modified Rankin (Stroke Patients Only) Modified Rankin (Stroke Patients Only) Pre-Morbid Rankin Score: No symptoms Modified Rankin: Severe disability     Balance Overall balance assessment: Needs assistance Sitting-balance support: Feet supported;Single extremity supported Sitting balance-Leahy Scale: Fair Sitting balance - Comments: Able to sit EOB without UE support and comb hair- posterior lean during dynamic tasks inconsistently requiring occasional Min A to return to upright.  Postural control: Posterior lean Standing balance support: During functional activity;Bilateral upper extremity supported Standing balance-Leahy Scale: Poor Standing balance comment: Reliant on assist of 2 for standing due to right neglect and heavy right lateral lean. No knee buckling however pt not able to self correct LOB to the right.  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 Son drives short distances and does some light chores.     Hand Dominance   Dominant Hand: Right    Extremity/Trunk Assessment   Upper Extremity Assessment Upper Extremity Assessment: Defer to OT evaluation    Lower Extremity Assessment Lower Extremity Assessment: RLE deficits/detail RLE Deficits / Details: Some spontaneous movement throughout RLE but does not move on command- difficult to accurately assess due to aphasia vs neglect? RLE Sensation:  (Responds to noxious stimuli in reflexive flexion pattern)       Communication   Communication: Receptive difficulties;Expressive difficulties  Cognition Arousal/Alertness: Lethargic Behavior During Therapy: Flat affect Overall Cognitive Status: Difficult to assess Area of Impairment: Orientation               General Comments: Pt nodding inconsistently to yes/no questions. Right neglect. Garbled, gibberish speech.     General Comments General comments (skin integrity, edema, etc.): Son and sister present outside room during session. BP elevated but in parameters    Exercises     Assessment/Plan    PT Assessment Patient needs continued PT services  PT Problem List Decreased strength;Decreased mobility;Impaired tone;Obesity;Decreased coordination;Decreased range of motion;Decreased activity tolerance;Pain;Decreased cognition;Decreased balance          PT Treatment Interventions DME instruction;Therapeutic activities;Patient/family education;Therapeutic exercise;Gait training;Stair training;Balance training;Neuromuscular re-education;Functional mobility training    PT Goals (Current goals can be found in the Care Plan section)  Acute Rehab PT  Goals Patient Stated Goal: none stated PT Goal Formulation: Patient unable to participate in goal setting Time For Goal Achievement: 06/14/16 Potential to Achieve Goals: Fair    Frequency Min 3X/week   Barriers to discharge Decreased caregiver support      Co-evaluation PT/OT/SLP Co-Evaluation/Treatment: Yes Reason for Co-Treatment: Complexity of the patient's impairments (multi-system involvement);For patient/therapist safety;To address functional/ADL transfers;Necessary to address cognition/behavior during functional activity PT goals addressed during session: Mobility/safety with mobility         End of Session Equipment Utilized During Treatment: Gait belt Activity Tolerance: Patient tolerated treatment well Patient left: in bed;with call bell/phone within reach;with family/visitor present;with SCD's reapplied;Other (comment) (with dietitian) Nurse Communication: Mobility status;Need for lift equipment         Time: [id number] PT Time Calculation (min) (ACUTE ONLY): 45 min   Charges:   PT Evaluation $PT Eval Moderate Complexity: 1 Procedure     PT G Codes:        Keveon Amsler A Francois Elk 05/31/2016, 2:35 PM Mylo RedShauna Galo Sayed, PT, DPT [phone]

## 2016-05-31 NOTE — Consult Note (Signed)
Physical Medicine and Rehabilitation Consult  Reason for Consult: Right sided weakness, difficulty speaking and visual deficits Referring Physician: Dr. Pearlean Brownie.    HPI: Ryan Anderson is a 61 y.o. male with history of HTN, obesity who was admitted on 10-Jun-2016 with RUE weakness, right facial weakness and slurred speech. History taken from chart review and sister. BP markedly elevated at 200/130 and patient in A fib with RVR.  CT head with evolving left basal ganglia infarct.  CTA head with abrupt occlusion of proximal left P2 segment and short segment severe right A2 stenosis. He was started on Cardene drip and tPA initiated but he had onset of expressive aphasia therefore this was held. Repeat CT without hemorrhage and tPA resumed.  Cardiology consulted and recommended DOAC when cleared by neuro and BB started to help with rate control.  2 D echo with EF 55-60% with moderate LVH and restrictive filling sever LAE. Follow up MRI done 1/23 showing acute large LEFT PCA territory infarct including LEFT thalamus with petechial hemorrhage, subacute LEFT MCA territory infarct involving LEFT basal ganglia and LEFT frontal lobe with petechial hemorrhage. Stroke felt to be embolic due to A fib.  He remains NPO but has had improvement in mentation.  Patient with right sided weakness, mixed receptive/expressive fluent aphasia and visual deficits.  Therapy evaluations ongoing and CIR recommended for follow up therapy.     Review of Systems  Unable to perform ROS: Patient nonverbal      Past Medical History:  Diagnosis Date  . Brachial neuritis    Right-RESOLVED-([place])  . Depressive disorder, not elsewhere classified    prev treated with celexa after wife died 25-Sep-2008  . Disturbance of skin sensation   . History of nephrolithiasis   . Hypogonadism male    BORDERLINE  . Insomnia, unspecified   . Obesity, unspecified   . Other chest pain   . Routine general medical examination at a health care  facility   . Screening examination for venereal disease   . Unspecified essential hypertension     Past Surgical History:  Procedure Laterality Date  . TONSILLECTOMY AND ADENOIDECTOMY  1962    Family History  Problem Relation Age of Onset  . Alcohol abuse Father   . Parkinsonism Father   . Hypertension Father     ?     Social History:  Single. 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 Daughter lives in [place]. Has a girlfriend and sister in town.  Per  reports that he has never smoked. He does not have any smokeless tobacco history on file. Per reports that he drinks beer occasionally.  He reports that he does not use drugs.    Allergies: No Known Allergies    No prescriptions prior to admission.    Home: Home Living Family/patient expects to be discharged to:: Private residence Living Arrangements: Children (with son, Madelaine Bhat) Available Help at Discharge: Family Type of Home: House Home Access: Stairs to enter Secretary/administrator of Steps: 2 Entrance Stairs-Rails: None Home Layout: One level Home Equipment: None  Lives With: Son  Functional History: Prior Function Level of Independence: Independent Comments: Works as a Visual merchandiser; caregiver for son who needs help initiating ADL task. Son drives short distances and does some light chores. Functional Status:  Mobility: Bed Mobility Overal bed mobility: Needs Assistance Bed Mobility: Supine to Sit, Sit to Supine Supine to sit: Min assist, HOB elevated Sit to supine: Mod assist, HOB  elevated General bed mobility comments: Assist to bring RLE to EOB and minimal assist to elevate trunk, cues for technique. Assist to bring RLE into bed to return to supine. Able to push through bil feet and adjust hips in bed with RLE stabilized.  Transfers Overall transfer level: Needs assistance Equipment used: 2 person hand held assist Transfers: Sit to/from Stand, Stand Pivot Transfers Sit to Stand: Max assist, +2  physical assistance, From elevated surface Stand pivot transfers: Max assist, Mod assist, +2 physical assistance General transfer comment: Stood from EOB x2 with assist of 2; right knee instability on first attempt. Manual cues for upright posture and hip extension. Heavy right lateral lean and not able to self correct to midline. SPT towards left side bed to/from chair with Max A of 2- more initiation on second transfer with no knee buckling right knee.      ADL:    Cognition: Cognition Overall Cognitive Status: Difficult to assess Arousal/Alertness: Lethargic Orientation Level: Other (comment) (UTA, expressive aphasia, responds yes to all questions) Attention: Sustained Sustained Attention: Impaired Sustained Attention Impairment: Verbal basic Cognition Arousal/Alertness: Lethargic Behavior During Therapy: Flat affect Overall Cognitive Status: Difficult to assess Area of Impairment: Orientation General Comments: Pt nodding inconsistently to yes/no questions. Right neglect. Garbled, gibberish speech.  Difficult to assess due to: Impaired communication   Blood pressure (!) 178/97, pulse 75, temperature 99 F (37.2 C), temperature source Oral, resp. rate 20, height 5' 8.9" (1.75 m), weight 126.6 kg (279 lb 1.6 oz), SpO2 96 %. Physical Exam  Nursing note and vitals reviewed. Constitutional: He appears well-developed. He appears lethargic. Nasal cannula in place.  Obese male. Arouses briefly to sternal rubs.   HENT:  Head: Normocephalic and atraumatic.  Eyes: Conjunctivae are normal. Pupils are equal, round, and reactive to light.  Neck: Normal range of motion. Neck supple.  Cardiovascular: Normal rate and regular rhythm.   Respiratory: Effort normal. No respiratory distress. He has rhonchi.  GI: Soft. Bowel sounds are normal. He exhibits no distension. There is no tenderness.  Musculoskeletal: He exhibits no edema or tenderness.  Neurological: He appears lethargic.  Non verbal  with oral secretions.  Difficult to engage, but movement noted with assistance of nursing: RUE ?1/5 RLE: ?0/5 LUE: 4+/5 proximal to distal LLE: ?3/5 proximal to distal DTRs symmetric Unable to assess sensation  Skin: Skin is warm and dry.  Psychiatric:  Unable to assess due to lethargy    Results for orders placed or performed during the hospital encounter of 06/02/2016 (from the past 24 hour(s))  Glucose, capillary     Status: Abnormal   Collection Time: 05/30/16  5:13 PM  Result Value Ref Range   Glucose-Capillary 196 (H) 65 - 99 mg/dL  Glucose, capillary     Status: Abnormal   Collection Time: 05/30/16  7:44 PM  Result Value Ref Range   Glucose-Capillary 150 (H) 65 - 99 mg/dL  Glucose, capillary     Status: Abnormal   Collection Time: 05/30/16 11:20 PM  Result Value Ref Range   Glucose-Capillary 172 (H) 65 - 99 mg/dL  Glucose, capillary     Status: Abnormal   Collection Time: 05/31/16  3:21 AM  Result Value Ref Range   Glucose-Capillary 192 (H) 65 - 99 mg/dL  Glucose, capillary     Status: Abnormal   Collection Time: 05/31/16  8:13 AM  Result Value Ref Range   Glucose-Capillary 198 (H) 65 - 99 mg/dL   Comment 1 Notify RN  Comment 2 Document in Chart   Glucose, capillary     Status: Abnormal   Collection Time: 05/31/16 11:37 AM  Result Value Ref Range   Glucose-Capillary 208 (H) 65 - 99 mg/dL   Comment 1 Notify RN    Comment 2 Document in Chart    Ct Angio Head W Or Wo Contrast  Result Date: 05/30/2016 CLINICAL DATA:  Initial evaluation for acute right-sided weakness. EXAM: CT ANGIOGRAPHY HEAD AND NECK TECHNIQUE: Multidetector CT imaging of the head and neck was performed using the standard protocol during bolus administration of intravenous contrast. Multiplanar CT image reconstructions and MIPs were obtained to evaluate the vascular anatomy. Carotid stenosis measurements (when applicable) are obtained utilizing NASCET criteria, using the distal internal carotid  diameter as the denominator. CONTRAST:  50 cc of Isovue 370. COMPARISON:  Prior CT from earlier the same day. FINDINGS: CTA NECK FINDINGS Aortic arch: Visualized aortic arch of normal caliber with normal branch pattern. No high-grade stenosis at the origin of the great vessels. Visualized subclavian arteries widely patent. Right carotid system: Right common carotid artery patent from its origin to the bifurcation. Minimal atheromatous plaque about the right bifurcation without stenosis. Right ICA widely patent to the skullbase without stenosis, dissection, or occlusion. Left carotid system: Left common carotid artery widely patent from its origin to the bifurcation. Atheromatous plaque about the left bifurcation/proximal left ICA. Short-segment narrowing of approximately 35% by NASCET criteria. Distally left ICA widely patent to the skullbase without stenosis, dissection, or occlusion. Vertebral arteries: Both of the vertebral arteries arise from the subclavian arteries. Left vertebral artery dominant. Vertebral arteries widely patent within the neck without stenosis, dissection, or occlusion. Skeleton: Moderate multilevel degenerative spondylolysis within the cervical spine. No acute osseous abnormality. No worrisome lytic or blastic osseous lesions. Other neck: Soft tissues of the neck demonstrate no acute abnormality. No adenopathy. Upper chest: Visualized upper mediastinum within normal limits. Visualized lungs are grossly clear. The scattered calcified granulomas noted. Review of the MIP images confirms the above findings CTA HEAD FINDINGS Anterior circulation: Petrous segments widely patent bilaterally. Minimal atheromatous plaque within the cavernous ICAs without significant stenosis. Supraclinoid segments widely patent. A1 segments patent. Anterior communicating artery not well visualized, and may be hypoplastic/absent. Short-segment severe right A2 stenosis noted (series 7, image 91). Mild atheromatous  irregularity within the left ACA. ACA is are patent to their distal aspects. M1 segments patent without stenosis or occlusion. MCA bifurcations normal. No proximal M2 occlusion. Distal MCA branches opacified and symmetric. Multifocal atheromatous irregularity throughout the distal MCA branches bilaterally. Posterior circulation: Vertebral arteries patent to the vertebrobasilar junction without stenosis. Left vertebral artery dominant. Posterior inferior cerebral arteries patent bilaterally. Basilar artery widely patent. Superior cerebellar arteries patent bilaterally. Posterior cerebral artery supplied via the basilar artery. Right PCA demonstrates multifocal atheromatous irregularity but is patent to its distal aspect. There is occlusion of the proximal left P 2 segment (Series 8, image 106). Absent flow distally. Venous sinuses: Patent. Anatomic variants: No significant anatomic variant. No aneurysm or vascular malformation. Delayed phase: Not performed. Review of the MIP images confirms the above findings IMPRESSION: 1. Abrupt occlusion of the proximal left P2 segment. 2. No other large or proximal arterial branch occlusion identified. 3. Short-segment severe right A2 stenosis. 4. Multifocal atheromatous irregularity involving the distal MCA branches bilaterally. 5. Short-segment atheromatous stenosis of approximately 35% at the proximal left ICA. Critical Value/emergent results were called by telephone at the time of interpretation on 05/30/2016 at 12:15 am to  Dr. Noel Christmas , who verbally acknowledged these results. In Electronically Signed   By: Rise Mu M.D.   On: 05/30/2016 00:23   Ct Head Wo Contrast  Result Date: 05/30/2016 CLINICAL DATA:  Initial evaluation for new onset aphasia, previous code stroke. EXAM: CT HEAD WITHOUT CONTRAST TECHNIQUE: Contiguous axial images were obtained from the base of the skull through the vertex without intravenous contrast. COMPARISON:  Prior CT and CTA  from earlier the same day. FINDINGS: Brain: Again seen is an evolving acute ischemic infarct involving the left basal ganglia, with involvement of the left caudate and lentiform nucleus as well as the anterior aspect of the left internal capsule. No significant mass effect. Additional patchy hypodensity more superiorly within the left frontal lobe again seen as well, also concerning for small foci of evolving ischemia. These are overall relatively similar to previous. There is subtle loss of gray-white matter differentiation within the parasagittal left occipital lobe with extension into the mesial left temporal lobe, concerning for evolving ischemia (series 2, image 15). This is consistent with previously identified left P2 occlusion. No significant mass effect. No other definite evidence for acute large vessel territory infarct. Gray-white matter differentiation otherwise maintained. No definite acute intracranial hemorrhage, although evaluation mildly limited as IV contrast is on board. Probable minimal patchy enhancement about the left frontal lobe infarcts noted. Remote right basal ganglia infarct again noted. No mass lesion, midline shift, or mass effect. Ventricle stable in size without evidence for hydrocephalus. No extra-axial fluid collection. Vascular: IV contrast throughout the intracranial vasculature related to recent CTA. Skull: Scalp soft tissues within normal limits.  Calvarium intact. Sinuses/Orbits: Globes and orbits demonstrate no acute abnormality. Patient gaze is directed to the left. Moderate paranasal sinus disease again noted. Mastoids remain largely clear. IMPRESSION: 1. Similar appearance of evolving left MCA territory infarcts involving the left basal ganglia and probable anterior left frontal lobe. 2. Subtle loss of gray-white matter differentiation within the parasagittal left tempo-occipital region, suspicious for evolving left PCA territory infarct. This is compatible with previously  identified left P2 occlusion. 3. No other new acute intracranial process identified. Electronically Signed   By: Rise Mu M.D.   On: 05/30/2016 01:24   Ct Angio Neck W Or Wo Contrast  Result Date: 05/30/2016 CLINICAL DATA:  Initial evaluation for acute right-sided weakness. EXAM: CT ANGIOGRAPHY HEAD AND NECK TECHNIQUE: Multidetector CT imaging of the head and neck was performed using the standard protocol during bolus administration of intravenous contrast. Multiplanar CT image reconstructions and MIPs were obtained to evaluate the vascular anatomy. Carotid stenosis measurements (when applicable) are obtained utilizing NASCET criteria, using the distal internal carotid diameter as the denominator. CONTRAST:  50 cc of Isovue 370. COMPARISON:  Prior CT from earlier the same day. FINDINGS: CTA NECK FINDINGS Aortic arch: Visualized aortic arch of normal caliber with normal branch pattern. No high-grade stenosis at the origin of the great vessels. Visualized subclavian arteries widely patent. Right carotid system: Right common carotid artery patent from its origin to the bifurcation. Minimal atheromatous plaque about the right bifurcation without stenosis. Right ICA widely patent to the skullbase without stenosis, dissection, or occlusion. Left carotid system: Left common carotid artery widely patent from its origin to the bifurcation. Atheromatous plaque about the left bifurcation/proximal left ICA. Short-segment narrowing of approximately 35% by NASCET criteria. Distally left ICA widely patent to the skullbase without stenosis, dissection, or occlusion. Vertebral arteries: Both of the vertebral arteries arise from the subclavian arteries. Left vertebral  artery dominant. Vertebral arteries widely patent within the neck without stenosis, dissection, or occlusion. Skeleton: Moderate multilevel degenerative spondylolysis within the cervical spine. No acute osseous abnormality. No worrisome lytic or blastic  osseous lesions. Other neck: Soft tissues of the neck demonstrate no acute abnormality. No adenopathy. Upper chest: Visualized upper mediastinum within normal limits. Visualized lungs are grossly clear. The scattered calcified granulomas noted. Review of the MIP images confirms the above findings CTA HEAD FINDINGS Anterior circulation: Petrous segments widely patent bilaterally. Minimal atheromatous plaque within the cavernous ICAs without significant stenosis. Supraclinoid segments widely patent. A1 segments patent. Anterior communicating artery not well visualized, and may be hypoplastic/absent. Short-segment severe right A2 stenosis noted (series 7, image 91). Mild atheromatous irregularity within the left ACA. ACA is are patent to their distal aspects. M1 segments patent without stenosis or occlusion. MCA bifurcations normal. No proximal M2 occlusion. Distal MCA branches opacified and symmetric. Multifocal atheromatous irregularity throughout the distal MCA branches bilaterally. Posterior circulation: Vertebral arteries patent to the vertebrobasilar junction without stenosis. Left vertebral artery dominant. Posterior inferior cerebral arteries patent bilaterally. Basilar artery widely patent. Superior cerebellar arteries patent bilaterally. Posterior cerebral artery supplied via the basilar artery. Right PCA demonstrates multifocal atheromatous irregularity but is patent to its distal aspect. There is occlusion of the proximal left P 2 segment (Series 8, image 106). Absent flow distally. Venous sinuses: Patent. Anatomic variants: No significant anatomic variant. No aneurysm or vascular malformation. Delayed phase: Not performed. Review of the MIP images confirms the above findings IMPRESSION: 1. Abrupt occlusion of the proximal left P2 segment. 2. No other large or proximal arterial branch occlusion identified. 3. Short-segment severe right A2 stenosis. 4. Multifocal atheromatous irregularity involving the  distal MCA branches bilaterally. 5. Short-segment atheromatous stenosis of approximately 35% at the proximal left ICA. Critical Value/emergent results were called by telephone at the time of interpretation on 05/30/2016 at 12:15 am to Dr. Noel Christmas , who verbally acknowledged these results. In Electronically Signed   By: Rise Mu M.D.   On: 05/30/2016 00:23   Mr Brain Wo Contrast  Result Date: 05/30/2016 CLINICAL DATA:  Acute onset RIGHT face and extremity weakness, slurred speech. Follow-up multi territorial infarcts, known LEFT P2 occlusion. History of hypertension. EXAM: MRI HEAD WITHOUT CONTRAST TECHNIQUE: Multiplanar, multiecho pulse sequences of the brain and surrounding structures were obtained without intravenous contrast. COMPARISON:  CT HEAD May 30, 2016 at 12:35 a.m. and CT HEAD 06/17/2016 FINDINGS: Multiple sequences are moderately motion degraded. BRAIN: Confluent reduced diffusion LEFT mesial temporal occipital lobe and posterior LEFT thalamus with low ADC value. Patchy susceptibility artifact LEFT thalamus. Minimal reduced diffusion and T2 bright signal LEFT anterior basal ganglia and LEFT frontal lobe with scattered superimposed LEFT frontal lobe susceptibility artifact. Old RIGHT basal ganglia cystic lacunar infarct with mineralization. Mild ex vacuo dilatation subjacent ventricle, mild to moderate ventriculomegaly on the basis of global parenchymal brain volume loss. Additional scattered subcentimeter supratentorial subcentimeter supratentorial white matter FLAIR T2 hyperintensities. No abnormal extra-axial fluid collections. VASCULAR: Normal major intracranial vascular flow voids present at skull base. SKULL AND UPPER CERVICAL SPINE: No abnormal sellar expansion. No suspicious calvarial bone marrow signal. Craniocervical junction maintained. SINUSES/ORBITS: Small bilateral mastoid effusions. Moderate to severe paranasal sinusitis. The included ocular globes and  orbital contents are non-suspicious. OTHER: None. IMPRESSION: 1. Moderately motion degraded examination. Acute large LEFT PCA territory infarct including LEFT thalamus with petechial hemorrhage. 2. Subacute LEFT MCA territory infarct involving LEFT basal ganglia and LEFT frontal  lobe with petechial hemorrhage. 3. Old RIGHT basal ganglia infarct. 4. Mild chronic small vessel ischemic disease. Mild to moderate parenchymal brain volume loss. 5. These results will be called to the ordering clinician or representative by the Radiologist Assistant, and communication documented in the [employer] Dashboard Electronically Signed   By: Awilda Metro M.D.   On: 05/30/2016 16:57   Ct Head Code Stroke W/o Cm  Result Date: 05/19/2016 CLINICAL DATA:  Code stroke. Initial evaluation for acute right-sided weakness. EXAM: CT HEAD WITHOUT CONTRAST TECHNIQUE: Contiguous axial images were obtained from the base of the skull through the vertex without intravenous contrast. COMPARISON:  None available. FINDINGS: Brain: Age-related cerebral atrophy with mild chronic microvascular ischemic disease. Remote lacunar infarct present within the right basal ganglia. There is evolving hypodensity within the left basal ganglia out, involving the caudate and lentiform nucleus, as well as the anterior limb of the left internal capsule. Additional patchy hypodensity within the anterior left frontal lobe may reflect additional small foci of evolving ischemia (series 2, image 23, 25). No other definite evidence for evolving ischemia. No acute intracranial hemorrhage. No mass lesion, midline shift, or mass effect. No hydrocephalus. No extra-axial fluid collection. Vascular: Vasculature mildly hyper dense in appearance, but is diffusely symmetric without definite hyperdense vessel. Skull: Scalp soft tissues within normal limits.  Calvarium intact. Sinuses/Orbits: Globes and orbits within normal limits. Moderate mucosal thickening within the maxillary  sinuses and ethmoidal air cells. Mastoids are clear. A duct Stewart av spine about 5 on a a has an evolving left basal ganglia infarct and al in additional possible patchy involvement within knee at anterior left frontal lobe of the supra ganglionic territory heads with what more on sure a solid at the a half media for PE at this this would be consistent with the left MCA ex it at ages vessels Inderal kind of bright echo hypo seated definite of the well ball pool sleeve with back 6 but in the ASPECTS [facility] Stroke Program Early CT Score) - Ganglionic level infarction (caudate, lentiform nuclei, internal capsule, insula, M1-M3 cortex): 4 - Supraganglionic infarction (M4-M6 cortex): 2 Total score (0-10 with 10 being normal): 6 IMPRESSION: 1. Acute evolving left basal ganglia infarct as above, with additional possible patchy involvement within the supra ganglionic anterior left frontal lobe. 2. ASPECTS is 6 3. Remote right basal ganglia infarct. 4. Mild chronic small vessel ischemic disease. Critical Value/emergent results were called by telephone at the time of interpretation on 05/21/2016 at 11:45 pm to Dr. Roseanne Reno he, who verbally acknowledged these results. Electronically Signed   By: Rise Mu M.D.   On: 05/20/2016 23:46    Assessment/Plan: Diagnosis: Acute left multiple CVA Labs and images independently reviewed.  Records reviewed and summated above. Stroke: Continue secondary stroke prophylaxis and Risk Factor Modification listed below:   Antiplatelet therapy:   Blood Pressure Management:  Continue current medication with prn's with permisive HTN per primary team Statin Agent:   Diabetes management:   Right sided hemiparesis: fit for orthosis to prevent contractures (resting hand splint for day, wrist cock up splint at night, PRAFO, etc) Motor recovery: Fluoxetine  1. Does the need for close, 24 hr/day medical supervision in concert with the patient's rehab needs make it unreasonable  for this patient to be served in a less intensive setting? Yes  2. Co-Morbidities requiring supervision/potential complications: NPO (advance diet as tolerated), moderate LVH with CHF (CHF (Monitor in accordance with increased physical activity and avoid UE resistance excercises, cont  meds), expressive aphasia, A fib with RVR (monitor HR with increased physical activity), HTN (monitor and provide prns in accordance with increased physical exertion and pain, transition from IV meds when appropriate), morbid obesity (Body mass index is 41.34 kg/m., diet and exercise education, encourage weight loss to increase endurance and promote overall health), AKI (avoid nephrotoxic meds), leukocytosis (cont to monitor for signs and symptoms of infection, further workup if indicated) 3. Due to bladder management, bowel management, safety, skin/wound care, disease management, medication administration and patient education, does the patient require 24 hr/day rehab nursing? Yes 4. Does the patient require coordinated care of a physician, rehab nurse, PT (1-2 hrs/day, 5 days/week), OT (1-2 hrs/day, 5 days/week) and SLP (1-2 hrs/day, 5 days/week) to address physical and functional deficits in the context of the above medical diagnosis(es)? Yes Addressing deficits in the following areas: balance, endurance, locomotion, strength, transferring, bowel/bladder control, bathing, dressing, feeding, grooming, toileting, cognition, speech, language, swallowing and psychosocial support 5. Can the patient actively participate in an intensive therapy program of at least 3 hrs of therapy per day at least 5 days per week? Not at present 6. The potential for patient to make measurable gains while on inpatient rehab is excellent 7. Anticipated functional outcomes upon discharge from inpatient rehab are supervision and min assist  with PT, supervision and min assist with OT, modified independent and supervision with SLP. 8. Estimated rehab  length of stay to reach the above functional goals is: 25-30 days. 9. Does the patient have adequate social supports and living environment to accommodate these discharge functional goals? Potentially 10. Anticipated D/C setting: SNF 11. Anticipated post D/C treatments: SNF 12. 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  If caregiver support available recommend CIR, otherwise SNF once medically stable and workup complete with PM&R outpatient follow up. Patient has agreed to participate in recommended program. Potentially Note that insurance prior authorization may be required for reimbursement for recommended care.  Comment: Rehab Admissions Coordinator to follow up.  Maryla Morrow, MD, [address] [place] 05/31/2016

## 2016-05-31 NOTE — Evaluation (Signed)
Occupational Therapy Evaluation Patient Details Name: Ryan Anderson MRN: [id number] DOB: [birth year] Today's Date: 05/31/2016    History of Present Illness Patient is a 61 y/o male with hx of HTN, depressive disorder and obesity presents with acute onset right-sided weakness and slurred speech. CT head- showed what appeared to be an evolcing left basal ganglia ischemic infarction. Pt had worsening of his speech and onset of expressive aphasia during tPA administration, but no signs of acute hemorrhage were noted on repeat CT, therefore tPA was resumed. 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    Clinical Impression   Pt admitted with above. He demonstrates the below listed deficits and will benefit from continued OT to maximize safety and independence with BADLs.  Pt presents to OT with Rt hemiparesis, Rt neglect/inattention, dysconjugate gaze, impaired balance, impaired communication.  He requires max A for ADLs.  Feel he would benefit from CIR to maximize safety and independence with ADLs and reduce burden of care.  Will follow acutely.       Follow Up Recommendations  CIR    Equipment Recommendations  None recommended by OT    Recommendations for Other Services Rehab consult     Precautions / Restrictions Precautions Precautions: Fall Precaution Comments: SBP <180 Restrictions Weight Bearing Restrictions: No      Mobility Bed Mobility Overal bed mobility: Needs Assistance Bed Mobility: Supine to Sit;Sit to Supine     Supine to sit: Min assist;HOB elevated Sit to supine: Mod assist;HOB elevated   General bed mobility comments: Assist to bring RLE to EOB and minimal assist to elevate trunk, cues for technique. Assist to bring RLE into bed to return to supine. Able to push through bil feet and adjust hips in bed with RLE stabilized.   Transfers Overall transfer level: Needs assistance Equipment used: 2 person hand  held assist Transfers: Sit to/from [employer] CorporationStand;Stand Pivot Transfers Sit to Stand: Max assist;+2 physical assistance;From elevated surface Stand pivot transfers: Max assist;Mod assist;+2 physical assistance       General transfer comment: Stood from EOB x2 with assist of 2; right knee instability on first attempt. Manual cues for upright posture and hip extension. Heavy right lateral lean and not able to self correct to midline. SPT towards left side bed to/from chair with Max A of 2- more initiation on second transfer with no knee buckling right knee.    Balance Overall balance assessment: Needs assistance Sitting-balance support: Feet supported;Single extremity supported Sitting balance-Leahy Scale: Fair Sitting balance - Comments: Able to sit EOB without UE support and comb hair- posterior lean during dynamic tasks inconsistently requiring occasional Min A to return to upright.  Postural control: Posterior lean Standing balance support: During functional activity;Bilateral upper extremity supported Standing balance-Leahy Scale: Poor Standing balance comment: Reliant on assist of 2 for standing due to right neglect and heavy right lateral lean. No knee buckling however pt not able to self correct LOB to the right.                            ADL Overall ADL's : Needs assistance/impaired Eating/Feeding: NPO   Grooming: Brushing hair;Maximal assistance;Sitting Grooming Details (indicate cue type and reason): Pt demonstrates correct use of comb, but requires assist for thoroughness  Upper Body Bathing: Maximal assistance;Sitting   Lower Body Bathing: Maximal assistance;Sit to/from stand   Upper Body Dressing : Total assistance;Sitting   Lower Body Dressing: Sit to/from  stand;Total assistance   Toilet Transfer: Maximal assistance;+2 for physical assistance;BSC           Functional mobility during ADLs: Maximal assistance;+2 for physical assistance       Vision  Additional Comments: Pt initially does not attend to Rt visual field, but toward end of session, was spontaneously looking to rt.  he closes Rt eye frequently, and appears to have dysconjugate gaze.  Horizontal nystagmus noted    Perception Perception Perception Tested?: Yes Perception Deficits: Inattention/neglect Inattention/Neglect: Does not attend to right visual field;Does not attend to right side of body Spatial deficits: Rt intattention/neglect    Praxis      Pertinent Vitals/Pain Pain Assessment: No/denies pain Faces Pain Scale: No hurt     Hand Dominance Right   Extremity/Trunk Assessment Upper Extremity Assessment Upper Extremity Assessment: RUE deficits/detail RUE Deficits / Details: Pt initially appears with flaccid Rt UE.  However, he will spontaneously attempt to use Rt UE to try to remove BP cuff on Lt UE.  He demonstrated ~80* elbow flexion actively and ~50% finger flex/ext   RUE Sensation: decreased proprioception RUE Coordination: decreased fine motor;decreased gross motor   Lower Extremity Assessment Lower Extremity Assessment: Defer to PT evaluation RLE Deficits / Details: Some spontaneous movement throughout RLE but does not move on command- difficult to accurately assess due to aphasia vs neglect? RLE Sensation:  (Responds to noxious stimuli in reflexive flexion pattern)       Communication Communication Communication: Receptive difficulties;Expressive difficulties   Cognition Arousal/Alertness: Lethargic Behavior During Therapy: Flat affect Overall Cognitive Status: Difficult to assess Area of Impairment: Orientation               General Comments: Pt nodding inconsistently to yes/no questions. Right neglect. Garbled, gibberish speech.    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 Son drives short distances and does some light chores.        OT Problem List: Decreased strength;Decreased activity tolerance;Impaired balance (sitting and/or standing);Impaired vision/perception;Decreased coordination;Decreased cognition;Decreased safety awareness;Decreased knowledge of use of DME or AE;Decreased knowledge of precautions;Impaired sensation;Impaired tone;Impaired UE functional use;Obesity;Decreased range of motion   OT Treatment/Interventions: Self-care/ADL training;Neuromuscular education;DME and/or AE instruction;Therapeutic activities;Cognitive remediation/compensation;Visual/perceptual remediation/compensation;Patient/family education;Balance training    OT Goals(Current goals can be found in the care plan section) Acute Rehab OT Goals Patient Stated Goal: none stated OT Goal Formulation: With patient/family Time For Goal Achievement: 06/14/16 Potential to Achieve Goals: Good ADL Goals Pt Will Perform Grooming: with min assist;sitting Pt Will Perform Upper Body Bathing: with min assist;sitting Pt Will Perform Lower Body Bathing: with mod assist;sit to/from stand Pt Will Transfer to Toilet: with min assist;stand pivot transfer;bedside commode;regular height toilet;grab bars Pt Will Perform Toileting - Clothing Manipulation and hygiene: with mod assist;sit to/from stand Additional ADL Goal #1: Pt will locate items on  Rt wtih min cues during ADLs  Additional ADL Goal #2: Pt will consistently use Rt UE as an active assist during ADLs  OT Frequency: Min 3X/week    Barriers to D/C: Decreased caregiver support          Co-evaluation PT/OT/SLP Co-Evaluation/Treatment: Yes Reason for Co-Treatment: Complexity of the patient's impairments (multi-system involvement);For patient/therapist safety PT goals addressed during session: Mobility/safety with mobility OT goals addressed during session: ADL's and self-care;Strengthening/ROM      End of Session Equipment Utilized During Treatment: Gait belt Nurse Communication: Mobility status  Activity Tolerance: Patient tolerated treatment well Patient left: in bed;with call bell/phone within reach;with family/visitor present;with nursing/sitter in room   Time: 4098-1191 OT Time Calculation (min): 42 min Charges:  OT General Charges $OT Visit: 1 Procedure OT Evaluation $OT Eval Moderate Complexity: 1 Procedure G-Codes:    Jeani Hawking M 06/18/16, 5:47 PM

## 2016-06-01 ENCOUNTER — Inpatient Hospital Stay (HOSPITAL_COMMUNITY): Payer: Self-pay

## 2016-06-01 DIAGNOSIS — I5031 Acute diastolic (congestive) heart failure: Secondary | ICD-10-CM

## 2016-06-01 DIAGNOSIS — R569 Unspecified convulsions: Secondary | ICD-10-CM

## 2016-06-01 DIAGNOSIS — I63532 Cerebral infarction due to unspecified occlusion or stenosis of left posterior cerebral artery: Secondary | ICD-10-CM

## 2016-06-01 DIAGNOSIS — J9600 Acute respiratory failure, unspecified whether with hypoxia or hypercapnia: Secondary | ICD-10-CM

## 2016-06-01 LAB — BLOOD GAS, ARTERIAL
ACID-BASE EXCESS: 1.2 mmol/L (ref 0.0–2.0)
Acid-Base Excess: 1.9 mmol/L (ref 0.0–2.0)
BICARBONATE: 25.5 mmol/L (ref 20.0–28.0)
Bicarbonate: 26 mmol/L (ref 20.0–28.0)
DRAWN BY: 398981
DRAWN BY: 39899
FIO2: 100
MECHVT: 570 mL
O2 CONTENT: 3 L/min
O2 SAT: 97.1 %
O2 SAT: 99.4 %
PATIENT TEMPERATURE: 99.8
PCO2 ART: 49.7 mmHg — AB (ref 32.0–48.0)
PEEP/CPAP: 5 cmH2O
PH ART: 7.345 — AB (ref 7.350–7.450)
PO2 ART: 94.7 mmHg (ref 83.0–108.0)
PO2 ART: 319 mmHg — AB (ref 83.0–108.0)
Patient temperature: 100.6
RATE: 16 resp/min
pCO2 arterial: 38.2 mmHg (ref 32.0–48.0)
pH, Arterial: 7.444 (ref 7.350–7.450)

## 2016-06-01 LAB — GLUCOSE, CAPILLARY
GLUCOSE-CAPILLARY: 198 mg/dL — AB (ref 65–99)
Glucose-Capillary: 140 mg/dL — ABNORMAL HIGH (ref 65–99)
Glucose-Capillary: 154 mg/dL — ABNORMAL HIGH (ref 65–99)
Glucose-Capillary: 162 mg/dL — ABNORMAL HIGH (ref 65–99)
Glucose-Capillary: 192 mg/dL — ABNORMAL HIGH (ref 65–99)
Glucose-Capillary: 209 mg/dL — ABNORMAL HIGH (ref 65–99)

## 2016-06-01 LAB — HEMOGLOBIN A1C
Hgb A1c MFr Bld: 8.7 % — ABNORMAL HIGH (ref 4.8–5.6)
Mean Plasma Glucose: 203 mg/dL

## 2016-06-01 LAB — TRIGLYCERIDES: Triglycerides: 66 mg/dL (ref <150)

## 2016-06-01 MED ORDER — VITAL HIGH PROTEIN PO LIQD
1000.0000 mL | ORAL | Status: DC
  Administered 2016-06-01 – 2016-06-06 (×3): 1000 mL

## 2016-06-01 MED ORDER — FENTANYL CITRATE (PF) 100 MCG/2ML IJ SOLN
INTRAMUSCULAR | Status: AC
  Administered 2016-06-01: 100 ug
  Filled 2016-06-01: qty 2

## 2016-06-01 MED ORDER — MIDAZOLAM HCL 2 MG/2ML IJ SOLN
INTRAMUSCULAR | Status: AC
  Administered 2016-06-01: 2 mg via INTRAVENOUS
  Filled 2016-06-01: qty 4

## 2016-06-01 MED ORDER — ORAL CARE MOUTH RINSE
15.0000 mL | OROMUCOSAL | Status: DC
  Administered 2016-06-01 – 2016-06-07 (×60): 15 mL via OROMUCOSAL

## 2016-06-01 MED ORDER — CHLORHEXIDINE GLUCONATE 0.12% ORAL RINSE (MEDLINE KIT)
15.0000 mL | Freq: Two times a day (BID) | OROMUCOSAL | Status: DC
  Administered 2016-06-01 – 2016-06-07 (×11): 15 mL via OROMUCOSAL

## 2016-06-01 MED ORDER — FENTANYL CITRATE (PF) 100 MCG/2ML IJ SOLN
INTRAMUSCULAR | Status: AC
  Administered 2016-06-01: 100 ug via INTRAVENOUS
  Filled 2016-06-01: qty 4

## 2016-06-01 MED ORDER — PROPOFOL 1000 MG/100ML IV EMUL
INTRAVENOUS | Status: AC
  Filled 2016-06-01: qty 100

## 2016-06-01 MED ORDER — MIDAZOLAM HCL 2 MG/2ML IJ SOLN
2.0000 mg | Freq: Once | INTRAMUSCULAR | Status: AC
  Administered 2016-06-01: 2 mg via INTRAVENOUS

## 2016-06-01 MED ORDER — FAMOTIDINE IN NACL 20-0.9 MG/50ML-% IV SOLN
20.0000 mg | Freq: Two times a day (BID) | INTRAVENOUS | Status: DC
  Administered 2016-06-01: 20 mg via INTRAVENOUS
  Filled 2016-06-01 (×2): qty 50

## 2016-06-01 MED ORDER — PROPOFOL 1000 MG/100ML IV EMUL
5.0000 ug/kg/min | INTRAVENOUS | Status: DC
  Administered 2016-06-01: 20 ug/kg/min via INTRAVENOUS
  Administered 2016-06-01: 10 ug/kg/min via INTRAVENOUS
  Administered 2016-06-01 – 2016-06-02 (×3): 20 ug/kg/min via INTRAVENOUS
  Administered 2016-06-03: 30 ug/kg/min via INTRAVENOUS
  Administered 2016-06-03: 20 ug/kg/min via INTRAVENOUS
  Administered 2016-06-03 (×2): 30 ug/kg/min via INTRAVENOUS
  Administered 2016-06-04 (×3): 25 ug/kg/min via INTRAVENOUS
  Administered 2016-06-04: 30 ug/kg/min via INTRAVENOUS
  Administered 2016-06-05: 25 ug/kg/min via INTRAVENOUS
  Filled 2016-06-01 (×17): qty 100

## 2016-06-01 MED ORDER — PRO-STAT SUGAR FREE PO LIQD
60.0000 mL | Freq: Every day | ORAL | Status: DC
  Administered 2016-06-01 – 2016-06-06 (×25): 60 mL
  Filled 2016-06-01 (×25): qty 60

## 2016-06-01 MED ORDER — VALPROATE SODIUM 500 MG/5ML IV SOLN
1500.0000 mg | Freq: Once | INTRAVENOUS | Status: AC
  Administered 2016-06-01: 1500 mg via INTRAVENOUS
  Filled 2016-06-01: qty 15

## 2016-06-01 MED ORDER — FENTANYL CITRATE (PF) 100 MCG/2ML IJ SOLN
100.0000 ug | Freq: Once | INTRAMUSCULAR | Status: AC
  Administered 2016-06-01: 100 ug via INTRAVENOUS

## 2016-06-01 MED ORDER — DEXTROSE 5 % IV SOLN
5.0000 mg/h | INTRAVENOUS | Status: DC
  Filled 2016-06-01: qty 100

## 2016-06-01 MED ORDER — ETOMIDATE 2 MG/ML IV SOLN
20.0000 mg | Freq: Once | INTRAVENOUS | Status: AC
  Administered 2016-06-01: 20 mg via INTRAVENOUS

## 2016-06-01 MED ORDER — FUROSEMIDE 10 MG/ML IJ SOLN
40.0000 mg | Freq: Once | INTRAMUSCULAR | Status: AC
  Administered 2016-06-01: 40 mg via INTRAVENOUS
  Filled 2016-06-01: qty 4

## 2016-06-01 MED ORDER — PIPERACILLIN-TAZOBACTAM 3.375 G IVPB
3.3750 g | Freq: Three times a day (TID) | INTRAVENOUS | Status: DC
  Administered 2016-06-01 – 2016-06-05 (×14): 3.375 g via INTRAVENOUS
  Filled 2016-06-01 (×14): qty 50

## 2016-06-01 MED ORDER — VALPROATE SODIUM 500 MG/5ML IV SOLN
750.0000 mg | Freq: Three times a day (TID) | INTRAVENOUS | Status: DC
  Administered 2016-06-01 – 2016-06-05 (×12): 750 mg via INTRAVENOUS
  Filled 2016-06-01 (×13): qty 7.5

## 2016-06-01 MED ORDER — ROCURONIUM BROMIDE 50 MG/5ML IV SOLN
100.0000 mg | Freq: Once | INTRAVENOUS | Status: AC
  Administered 2016-06-01: 100 mg via INTRAVENOUS
  Filled 2016-06-01: qty 10

## 2016-06-01 NOTE — Progress Notes (Signed)
eLink Physician-Brief Progress Note Patient Name: Ryan Anderson DOB: 1956-05-06 MRN: [id number]   Date of Service  06/01/2016  HPI/Events of Note  Low uop, 500 cc residual  eICU Interventions  Place foley     Intervention Category Minor Interventions: Routine modifications to care plan (e.g. PRN medications for pain, fever)  Sandrea HughsMichael Arleatha Philipps 06/01/2016, 5:24 PM

## 2016-06-01 NOTE — Progress Notes (Signed)
Patient transported to CT and back to room 3M07 without complications.

## 2016-06-01 NOTE — Progress Notes (Signed)
RT note: Sputum sample obtained and sent down to main lab without complications.   

## 2016-06-01 NOTE — Progress Notes (Signed)
PT Cancellation Note  Patient Details Name: Ryan Anderson MRN: [id number] DOB: [birth year]   Cancelled Treatment:    Reason Eval/Treat Not Completed: Medical issues which prohibited therapy Holding PT today as concern for aspiration PNA as pt pulled out cortrak yesterday. Pt with increased work of breathing this morning with desaturations. Will follow up as appropriate.    Ryan Anderson 06/01/2016, 10:36 AM Ryan Anderson, PT, DPT [phone]

## 2016-06-01 NOTE — Progress Notes (Signed)
Bedside EEG completed, results pending. 

## 2016-06-01 NOTE — Progress Notes (Signed)
Patient's HR is irregular, A fib. going from 90s to 140s.   MD notified.  No new orders at this time.

## 2016-06-01 NOTE — Progress Notes (Signed)
OT Cancellation Note  Patient Details Name: Mearl LatinSamuel G Lucking MRN: [id number] DOB: 06/21/1955   Cancelled Treatment:    Reason Eval/Treat Not Completed: Medical issues which prohibited therapy.   Pt with increased WOB due to possible aspiration.  Will check back.   [email or web address]   Joshuah Minella, Ursula AlertWendi M 06/01/2016, 11:11 AM

## 2016-06-01 NOTE — Progress Notes (Signed)
STROKE TEAM PROGRESS NOTE   HISTORY OF PRESENT ILLNESS (per record) Ryan Anderson is an 61 y.o. male with a history of hypertension and obesity, brought to the ED in code stroke status following acute onset of weakness involving right face and extremities along with slurred speech. Onset was at 9:45 PM. He has no previous history of stroke nor TIA. He was found to be in atrial fibrillation with RVR which had not previously been recognized. His blood pressure was markedly elevated and he had hyperglycemia in the 200s. 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 CT angiogram showed proximal left P2 PCA occlusion. There was no MCA occlusion. Patient was deemed a candidate for TPA which was administered after getting his blood pressure controlled which required Cardene drip after labetalol proved to be ineffective. There was worsening of his speech with onset of expressive aphasia during TPA infusion. TPA was suspended and repeat CT scan of the head was obtained which showed no signs of acute hemorrhage. TPA was resumed. Patient was not deemed a candidate for IR intervention, and was admitted to neuro ICU for further management.  LSN: 9:45 PM on 06/09/2016 tPA Given: Yes. tPA was delayed because of effort required to manage patient's hypertensive urgency. mRankin:   SUBJECTIVE (INTERVAL HISTORY) The patient's RN is the bedside. The patient was lethargic and aphasic and has developed resp distress overnight with inability to protect his airway. Right hemiparesis persists.No witnessed seizure activity but patient is sleepier and less arousable this am OBJECTIVE Temp:  [98.4 F (36.9 C)-99.8 F (37.7 C)] 98.4 F (36.9 C) (01/25 0800) Pulse Rate:  [58-132] 121 (01/25 1205) Cardiac Rhythm: Atrial fibrillation (01/25 0800) Resp:  [0-37] 16 (01/25 1205) BP: (129-182)/(82-132) 171/98 (01/25 1205) SpO2:   [91 %-100 %] 100 % (01/25 1205) FiO2 (%):  [100 %] 100 % (01/25 1205)  CBC:   Recent Labs Lab 2016-06-09 2315 06-09-2016 2323  WBC 11.4*  --   NEUTROABS 8.1*  --   HGB 16.9 17.0  HCT 47.6 50.0  MCV 90.0  --   PLT 225  --     Basic Metabolic Panel:   Recent Labs Lab 06-09-16 2315 06-09-2016 2323  NA 138 140  K 3.7 3.5  CL 101 100*  CO2 25  --   GLUCOSE 235* 237*  BUN 17 21*  CREATININE 1.26* 1.20  CALCIUM 9.4  --     Lipid Panel:     Component Value Date/Time   CHOL 159 05/30/2016 0340   TRIG 58 05/30/2016 0340   HDL 41 05/30/2016 0340   CHOLHDL 3.9 05/30/2016 0340   VLDL 12 05/30/2016 0340   LDLCALC 106 (H) 05/30/2016 0340   HgbA1c:  Lab Results  Component Value Date   HGBA1C 8.7 (H) 05/31/2016   Urine Drug Screen:     Component Value Date/Time   LABOPIA NONE DETECTED 06/09/2016 2336   COCAINSCRNUR NONE DETECTED Jun 09, 2016 2336   LABBENZ NONE DETECTED 09-Jun-2016 2336   AMPHETMU NONE DETECTED Jun 09, 2016 2336   THCU NONE DETECTED 06-09-16 2336   LABBARB NONE DETECTED 06/09/16 2336      IMAGING  Ct Angio Head and Neck W Or Wo Contrast 05/30/2016 1. Abrupt occlusion of the proximal left P2 segment.  2. No other large or proximal arterial branch occlusion identified.  3. Short-segment severe right A2 stenosis.  4. Multifocal atheromatous irregularity involving the  distal MCA branches bilaterally.  5. Short-segment atheromatous stenosis of approximately 35% at the proximal left ICA.     Ct Head Wo Contrast 05/30/2016 1. Similar appearance of evolving left MCA territory infarcts involving the left basal ganglia and probable anterior left frontal lobe.  2. Subtle loss of gray-white matter differentiation within the parasagittal left tempo-occipital region, suspicious for evolving left PCA territory infarct. This is compatible with previously identified left P2 occlusion.  3. No other new acute intracranial process identified.     Ct Head Code  Stroke W/o Cm 12-11-2016 1. Acute evolving left basal ganglia infarct as above, with additional possible patchy involvement within the supra ganglionic anterior left frontal lobe.  2. ASPECTS is 6  3. Remote right basal ganglia infarct.  4. Mild chronic small vessel ischemic disease.    MRI Brain W/o Contrast - pending    PHYSICAL EXAM  Obese middle aged male not in distress. . Afebrile. Head is nontraumatic. Neck is supple without bruit.    Cardiac exam no murmur or gallop. Lungs are clear to auscultation. Distal pulses are well felt.  Neurological Exam :  stuporose arouses with difficulty. Right pupil is slightly larger 4 mm left is 3 mm but both are reactive. There is no ptosis or ophthalmoplegia. Blinks to threat more on the left compared to the right. Fundi were not visualized. Patient almost nonverbal and speaks only a few words and comprehension also appears to be impaired. Can follow only simple midline and one-step commands. Right hemiplegia with grade 1/5 strength in the right upper extremity with tapered to been on his fingers. Grade 2/5 strength in the right lower extremity. Purposeful antigravity strength on the left without weakness. Sensation appears diminished on the right compared to the left. Right plantar is upgoing left is downgoing. Reflexes are depressed on the right. Gait was not tested.    ASSESSMENT/PLAN Ryan Anderson is a 61 y.o. male with history of hypertension, depression, new onset atrial fibrillation without anticoagulation, hyperglycemia, and obesity presenting with dysarthria with right sided weakness.  He received IV t-PA Monday, 008-10-2016, at 2330.   Stroke:  Dominant infarct embolic secondary to atrial fibrillation.  Resultant  Right hemiparesis and field cut  MRI - motion degraded. Large left PCA infarct and subacute left MCA and basal ganglia and frontal lobe infarcts. Old right basal ganglia infarct. MRA - not performed  CTA H&N - Abrupt  occlusion of the proximal left P2 segment. Short-segment severe right A2 stenosis.  Carotid Doppler - see  CTA neck 2D Echo - - Left ventricle: The cavity size was normal. Wall thickness was   increased in a pattern of moderate LVH. Systolic function was   normal. The estimated ejection fraction was in the range of 55%    to 60%. Wall motion was normal;   LDL - 106  HgbA1c - 8.8   VTE prophylaxis - SCDs Diet NPO time specified  No antithrombotic prior to admission, now on No antithrombotic secondary to t-PA therapy  Ongoing aggressive stroke risk factor management  Therapy recommendations: pending  Disposition:  Pending  Hypertension  Stable - Cardene drip  Permissive hypertension (OK if < 220/120) but gradually normalize in 5-7 days  Long-term BP goal normotensive   Hyperlipidemia  Home meds:  No lipid lowering medications prior to admission.  LDL 106, goal < 70  Add statin when PO access is available  Continue statin at discharge    Other Stroke Risk Factors  Advanced  age  ETOH use, advised to drink no more than 1 drink per day  Obesity, Body mass index is 41.34 kg/m., recommend weight loss, diet and exercise as appropriate   Hx stroke/TIA - remote basal ganglia infarct by imaging  New onset atrial fibrillation  Other Active Problems  New-onset atrial fibrillation - appreciate cardiology consult - will need anticoagulation when appropriate.  Elevated glucose levels  Mild leukocytosis (afebrile)  Short-segment severe right A2 stenosis.   New worsening mental status and resp distress 06/01/16 ? Aspiration pneumonia versus neuro change Plan repeat head Ct today , check EEG for seizures and PCCM consult for possible intubation    Hospital day # 2  Delton See PA-C [facility] Pager [phone] 06/01/2016, 12:46 PM I have personally examined this patient, reviewed notes, independently viewed imaging studies, participated in  medical decision making and plan of care.ROS completed by me personally and pertinent positives fully documented  I have made any additions or clarifications directly to the above note. Agree with note above. He presented with right hemiparesis due to left hemispheric posterior cerebral artery infarct from left P2 occlusion likely from new onset atrial fibrillation and received IV tPA. They had fluctuating examined tPA had to be temporarily stopped but follow-up imaging did not show any post TPA hemorrhage. Continue ongoing close neurological monitoring and strict blood pressure control. Appreciate Cardiology consult for new onset atrial fibrillation.I spoke to sister  And son at bedside and gave update and answered questions. New worsening mental status and resp distress 06/01/16 ? Aspiration pneumonia versus neuro change Plan repeat head Ct today , check EEG for seizures and PCCM consult for possible intubation This patient is critically ill and at significant risk of neurological worsening, death and care requires constant monitoring of vital signs, hemodynamics,respiratory and cardiac monitoring, extensive review of multiple databases, frequent neurological assessment, discussion with family, other specialists and medical decision making of high complexity.I have made any additions or clarifications directly to the above note.This critical care time does not reflect procedure time, or teaching time or supervisory time of PA/NP/Med Resident etc but could involve care discussion time.  I spent 50 minutes of neurocritical care time  in the care of  this patient.      Delia Heady, MD Medical Director [facility] Pager: [phone] 06/01/2016 9:46 AM   To contact Stroke Continuity provider, please refer to [email or web address]. After hours, contact General Neurology

## 2016-06-01 NOTE — Progress Notes (Addendum)
At 0100, RT nasotracheal suctioned patient due to audible rhonchi/crackle breath sounds. RT obtained minimal amount of yellow, followed by clear, thick to thin secretions. Patient sounded slightly better following suction. RT will continue to monitor.

## 2016-06-01 NOTE — Progress Notes (Signed)
Progress Note  Patient Name: Ryan Anderson Date of Encounter: 06/01/2016  Primary Cardiologist: New. Dr. [place]  Subjective   Worsening neurologic status this am. Noted increased work of breathing and desaturation.   Inpatient Medications    Scheduled Meds: . aspirin  325 mg Per Tube Daily  . insulin aspart  2-6 Units Subcutaneous Q4H  . metoprolol tartrate  25 mg Oral BID  . pantoprazole sodium  40 mg Per Tube Daily  . piperacillin-tazobactam (ZOSYN)  IV  3.375 g Intravenous Q8H   Continuous Infusions: . sodium chloride 75 mL/hr at 06/01/16 0700  . diltiazem (CARDIZEM) infusion    . feeding supplement (JEVITY 1.2 CAL) Stopped (06/01/16 0000)  . niCARDipine Stopped (05/31/16 1019)   PRN Meds: acetaminophen **OR** acetaminophen (TYLENOL) oral liquid 160 mg/5 mL **OR** acetaminophen, labetalol, metoprolol   Vital Signs    Vitals:   06/01/16 0700 06/01/16 0730 06/01/16 0800 06/01/16 0830  BP: (!) 129/91 (!) 147/132 (!) 143/95   Pulse: (!) 58 (!) 132 85 95  Resp: (!) 25 (!) 37 (!) 9 (!) 0  Temp:   98.4 F (36.9 C)   TempSrc:   Axillary   SpO2: 92% 94% 93% 93%  Weight:      Height:        Intake/Output Summary (Last 24 hours) at 06/01/16 0928 Last data filed at 06/01/16 0700  Gross per 24 hour  Intake          2215.42 ml  Output             1180 ml  Net          1035.42 ml   Filed Weights   05/28/2016 2342  Weight: 279 lb 1.6 oz (126.6 kg)    Telemetry    Recurrent Afib with rate 100-115. Higher rates noted last night- Personally Reviewed  ECG    05/30/16: NSR with diffuse T wave inversion - Personally Reviewed  Physical Exam    GEN: less responsive.   Neck: No JVD Cardiac: IRRR, no murmurs, rubs, or gallops.  Respiratory: Coarse BS with rhonchi GI: Soft, nontender, non-distended  MS: No edema; No deformity. Neuro:  decreased responsiveness   Labs    Chemistry  Recent Labs Lab 06/01/2016 2315 05/15/2016 2323  NA 138 140  K 3.7 3.5  CL 101  100*  CO2 25  --   GLUCOSE 235* 237*  BUN 17 21*  CREATININE 1.26* 1.20  CALCIUM 9.4  --   PROT 7.3  --   ALBUMIN 3.7  --   AST 37  --   ALT 31  --   ALKPHOS 103  --   BILITOT 0.7  --   GFRNONAA 59*  --   GFRAA >60  --   ANIONGAP 12  --      Hematology  Recent Labs Lab 05/30/2016 2315 05/25/2016 2323  WBC 11.4*  --   RBC 5.29  --   HGB 16.9 17.0  HCT 47.6 50.0  MCV 90.0  --   MCH 31.9  --   MCHC 35.5  --   RDW 12.7  --   PLT 225  --     Cardiac EnzymesNo results for input(s): TROPONINI in the last 168 hours.   Recent Labs Lab 05/24/2016 2321  TROPIPOC 0.03     BNPNo results for input(s): BNP, PROBNP in the last 168 hours.   DDimer No results for input(s): DDIMER in the last 168 hours.   Radiology  Mr Brain Wo Contrast  Result Date: 05/30/2016 CLINICAL DATA:  Acute onset RIGHT face and extremity weakness, slurred speech. Follow-up multi territorial infarcts, known LEFT P2 occlusion. History of hypertension. EXAM: MRI HEAD WITHOUT CONTRAST TECHNIQUE: Multiplanar, multiecho pulse sequences of the brain and surrounding structures were obtained without intravenous contrast. COMPARISON:  CT HEAD May 30, 2016 at 12:35 a.m. and CT HEAD Jun 23, 2016 FINDINGS: Multiple sequences are moderately motion degraded. BRAIN: Confluent reduced diffusion LEFT mesial temporal occipital lobe and posterior LEFT thalamus with low ADC value. Patchy susceptibility artifact LEFT thalamus. Minimal reduced diffusion and T2 bright signal LEFT anterior basal ganglia and LEFT frontal lobe with scattered superimposed LEFT frontal lobe susceptibility artifact. Old RIGHT basal ganglia cystic lacunar infarct with mineralization. Mild ex vacuo dilatation subjacent ventricle, mild to moderate ventriculomegaly on the basis of global parenchymal brain volume loss. Additional scattered subcentimeter supratentorial subcentimeter supratentorial white matter FLAIR T2 hyperintensities. No abnormal  extra-axial fluid collections. VASCULAR: Normal major intracranial vascular flow voids present at skull base. SKULL AND UPPER CERVICAL SPINE: No abnormal sellar expansion. No suspicious calvarial bone marrow signal. Craniocervical junction maintained. SINUSES/ORBITS: Small bilateral mastoid effusions. Moderate to severe paranasal sinusitis. The included ocular globes and orbital contents are non-suspicious. OTHER: None. IMPRESSION: 1. Moderately motion degraded examination. Acute large LEFT PCA territory infarct including LEFT thalamus with petechial hemorrhage. 2. Subacute LEFT MCA territory infarct involving LEFT basal ganglia and LEFT frontal lobe with petechial hemorrhage. 3. Old RIGHT basal ganglia infarct. 4. Mild chronic small vessel ischemic disease. Mild to moderate parenchymal brain volume loss. 5. These results will be called to the ordering clinician or representative by the Radiologist Assistant, and communication documented in the [facility] Dashboard Electronically Signed   By: Awilda Metro M.D.   On: 05/30/2016 16:57   Dg Chest Port 1 View  Result Date: 06/01/2016 CLINICAL DATA:  Acute onset of respiratory distress after possible aspiration. Initial encounter. EXAM: PORTABLE CHEST 1 VIEW COMPARISON:  CT of the chest performed 02/13/2005 FINDINGS: The lungs are well-aerated. Vascular congestion is noted. Increased interstitial markings raise concern for pulmonary edema. There is no evidence of pleural effusion or pneumothorax. The cardiomediastinal silhouette is mildly enlarged. No acute osseous abnormalities are seen. IMPRESSION: Vascular congestion and mild cardiomegaly. Increased interstitial markings raise concern for pulmonary edema. The appearance is less typical for aspiration pneumonia. Electronically Signed   By: Roanna Raider M.D.   On: 06/01/2016 01:43   Dg Abd Portable 1v  Result Date: 05/31/2016 CLINICAL DATA:  Evaluate feeding tube placement EXAM: PORTABLE ABDOMEN - 1 VIEW  COMPARISON:  None FINDINGS: The feeding tube tip is in the distal stomach. No dilated bowel loops identified. IMPRESSION: 1. Feeding tube tip is in the distal stomach. Electronically Signed   By: Signa Kell M.D.   On: 05/31/2016 16:08    Cardiac Studies   Echo: 05/30/16: Study Conclusions  - Left ventricle: The cavity size was normal. Wall thickness was   increased in a pattern of moderate LVH. Systolic function was   normal. The estimated ejection fraction was in the range of 55%   to 60%. Wall motion was normal; there were no regional wall   motion abnormalities. Doppler parameters are consistent with   restrictive physiology, indicative of decreased left ventricular   diastolic compliance and/or increased left atrial pressure. - Mitral valve: Calcified annulus. - Left atrium: The atrium was severely dilated.  Impressions:  - Technically difficult; definity used; normal LV systolic   function; moderate  LVH; restrictive filling severe LAE.  Patient Profile     61 y.o. male presented to the ED on 10/25/2016 with right sided paralysis, facial droop and slurred speech. 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 CT angiogram showed proximal left P2 PCA occlusion. There was no MCA occlusion.   He was in atrial fibrillation with RVR with rate on 111 on arrival to the ED. He was given TPA (which was paused for a short time as the patient developed aphasia) and transferred to 16M. He has no prior history of atrial fibrillation. He was markedly hypertensive and started on a cardene gtt.  Assessment & Plan    1. Atrial fibrillation with RVR. Recurrent. Rate currently 100-115 which is acceptable. NPO now due ot decline in neurologic function. If rate goes up would consider IV diltiazem for rate control. Oral metoprolol on hold.  2. HTN - goal of normotension. Avoid hypotension. Off IV  cardene now. BP lower in afib. 3. Diastolic dysfunction by Echo. Appears to have some pulmonary edema on CXR. Will give IV lasix x 1 today. 4. Acute Left PCA and left MCA territory  Infarct c/w embolic event. Neuro status decline. Repeat CT head ordered. If no bleed may consider IV heparin. Unable to take oral anticoagulation now.  5. Hypercholesterolemia. Recommend statin therapy  Signed, Hughey Rittenberry [place], MD  06/01/2016, 9:28 AM

## 2016-06-01 NOTE — Procedures (Signed)
HPI:  61 y/o with history of stroke  TECHNICAL SUMMARY:  A multichannel referential and bipolar montage EEG using the standard international 10-20 system was performed on the patient described as encephalopathic.  There is no occipital dominant rhythm.  4-5 Hz activity can be seen in all head regions.  2-3 Hz activity can be seen in frontal head regions.    ACTIVATION:  Stepwise photic stimulation and hyperventilation are not performed.  EPILEPTIFORM ACTIVITY:  Runs of spike and rhythmic polyspike and wave in the left occipital region are noted, which may represent seizure activity.  These lasted up to 30 seconds in nature.  SLEEP:  No physiologic sleep  CARDIAC:  The EKG lead revealed an irregular rhythm  IMPRESSION:  This is an abnormal EEG demonstrating:  1.  A diffuse slowing of electrocerebral activity.  This can be seen in a wide variety of encephalopathic states including those of a toxic, metabolic, or degenerative nature.    2.  Runs of rhythmic spike and polyspike and wave activity in the left occipital region, which may represent seizure activity.  These lasted up to 30 seconds in nature.  Clinical correlation is required.

## 2016-06-01 NOTE — Procedures (Signed)
Intubation Procedure Note Mearl LatinSamuel G Hardeman [id number] [birth year]  Procedure: Intubation Indications: Airway protection and maintenance  Procedure Details Consent: Risks of procedure as well as the alternatives and risks of each were explained to the (patient/caregiver).  Consent for procedure obtained. Time Out: Verified patient identification, verified procedure, site/side was marked, verified correct patient position, special equipment/implants available, medications/allergies/relevent history reviewed, required imaging and test results available.  Performed  Hyacinth MeekerMiller and 3   Evaluation Hemodynamic Status: BP stable throughout; O2 sats: stable throughout Patient's Current Condition: stable Complications: No apparent complications Patient did tolerate procedure well. Chest X-ray ordered to verify placement.  CXR: pending.   Khing Belcher 06/01/2016

## 2016-06-01 NOTE — Consult Note (Signed)
PULMONARY / CRITICAL CARE MEDICINE   Name: Ryan Anderson MRN: [id number] DOB: [birth year]    ADMISSION DATE:  09/10/2016 CONSULTATION DATE: 1/25  REFERRING MD: Neuro  CHIEF COMPLAINT: Stroke  HISTORY OF PRESENT ILLNESS:   MO(279 lbs) male who was admitted 1/24 with right sided weakness, new onset of Afib, who went to CT/MRI and was diagnosed with new left subacute PCA infarct involving thalamus. 1/25 he developed increasing lethargy and dysarthria with inability to protect his airway. PCCM called to bedside for increasing resp distress and inability to protect his airway. He may well need intubation  PAST MEDICAL HISTORY :  He  has a past medical history of Brachial neuritis; Depressive disorder, not elsewhere classified; Disturbance of skin sensation; History of nephrolithiasis; Hypogonadism male; Insomnia, unspecified; Obesity, unspecified; Other chest pain; Routine general medical examination at a health care facility; Screening examination for venereal disease; and Unspecified essential hypertension.  PAST SURGICAL HISTORY: He  has a past surgical history that includes Tonsillectomy and adenoidectomy (1962).  No Known Allergies  No current facility-administered medications on file prior to encounter.    No current outpatient prescriptions on file prior to encounter.    FAMILY HISTORY:  His indicated that his mother is deceased. He indicated that his father is deceased.    SOCIAL HISTORY: He  reports that he has never smoked. He does not have any smokeless tobacco history on file. He reports that he drinks alcohol. He reports that he does not use drugs.  REVIEW OF SYSTEMS:   NA  SUBJECTIVE:  Lethargic ans unable to protect airway  VITAL SIGNS: BP (!) 143/95 (BP Location: Left Arm)   Pulse 95   Temp 98.4 F (36.9 C) (Axillary)   Resp (!) 0   Ht 5' 8.9" (1.75 m)   Wt 279 lb 1.6 oz (126.6 kg)   SpO2 93%   BMI 41.34 kg/m   HEMODYNAMICS:    VENTILATOR SETTINGS:     INTAKE / OUTPUT: I/O last 3 completed shifts: In: 3317.1 [I.V.:2884.6; NG/GT:382.5; IV Piggyback:50] Out: 2425 [Urine:2425]  PHYSICAL EXAMINATION: General: MOWM with decreased loc Neuro: Rt side weakness, left spontaneous movement HEENT:  No JVD/LAN Cardiovascular:  HSIR IR Lungs:  Congested gurgling breat sounds Abdomen: Obese + bs Musculoskeletal: Intact Skin:warm and dry  LABS:  BMET  Recent Labs Lab 2017/04/17 2315 2017/04/17 2323  NA 138 140  K 3.7 3.5  CL 101 100*  CO2 25  --   BUN 17 21*  CREATININE 1.26* 1.20  GLUCOSE 235* 237*    Electrolytes  Recent Labs Lab 2017/04/17 2315  CALCIUM 9.4    CBC  Recent Labs Lab 2017/04/17 2315 2017/04/17 2323  WBC 11.4*  --   HGB 16.9 17.0  HCT 47.6 50.0  PLT 225  --     Coag's  Recent Labs Lab 2017/04/17 2315  APTT 30  INR 0.95    Sepsis Markers No results for input(s): LATICACIDVEN, PROCALCITON, O2SATVEN in the last 168 hours.  ABG  Recent Labs Lab 06/01/16 0115  PHART 7.444  PCO2ART 38.2  PO2ART 94.7    Liver Enzymes  Recent Labs Lab 2017/04/17 2315  AST 37  ALT 31  ALKPHOS 103  BILITOT 0.7  ALBUMIN 3.7    Cardiac Enzymes No results for input(s): TROPONINI, PROBNP in the last 168 hours.  Glucose  Recent Labs Lab 05/31/16 1137 05/31/16 1624 05/31/16 2002 05/31/16 2337 06/01/16 0335 06/01/16 0805  GLUCAP 208* 191* 205* 217* 209* 192*  Imaging Dg Chest Port 1 View  Result Date: 06/01/2016 CLINICAL DATA:  Acute onset of respiratory distress after possible aspiration. Initial encounter. EXAM: PORTABLE CHEST 1 VIEW COMPARISON:  CT of the chest performed 02/13/2005 FINDINGS: The lungs are well-aerated. Vascular congestion is noted. Increased interstitial markings raise concern for pulmonary edema. There is no evidence of pleural effusion or pneumothorax. The cardiomediastinal silhouette is mildly enlarged. No acute osseous abnormalities are seen. IMPRESSION: Vascular congestion and  mild cardiomegaly. Increased interstitial markings raise concern for pulmonary edema. The appearance is less typical for aspiration pneumonia. Electronically Signed   By: Roanna Raider M.D.   On: 06/01/2016 01:43   Dg Abd Portable 1v  Result Date: 05/31/2016 CLINICAL DATA:  Evaluate feeding tube placement EXAM: PORTABLE ABDOMEN - 1 VIEW COMPARISON:  None FINDINGS: The feeding tube tip is in the distal stomach. No dilated bowel loops identified. IMPRESSION: 1. Feeding tube tip is in the distal stomach. Electronically Signed   By: Signa Kell M.D.   On: 05/31/2016 16:08     STUDIES:    CULTURES:   ANTIBIOTICS: 1/25 zoysn>>  SIGNIFICANT EVENTS:   LINES/TUBES:   DISCUSSION: MO(279 lbs) male who was admitted 1/24 with right sided weakness, new onset of Afib, who went to CT/MRI and was diagnosed with new left subacute PCA infarct involving thalamus. 1/25 he developed increasing lethargy and dysarthria with inability to protect his airway. PCCM called to bedside for increasing resp distress and inability to protect his airway. He may well need intubation   ASSESSMENT / PLAN:  PULMONARY A: Inability to protect airway post embolic stroke Suspected OSA Suspected ongoing aspiration P:   Evaluate for intubation. He is unable to protect airway. Suspect he aspirating oral secretions.  CARDIOVASCULAR A:  New onset Afib HTN P:  Rate control per cards ? If he can be anticogulated  RENAL Lab Results  Component Value Date   CREATININE 1.20 05/13/2016   CREATININE 1.26 (H) 05/28/2016   CREATININE 1.1 01/12/2012    Recent Labs Lab 06/03/2016 2315 05/08/2016 2323  K 3.7 3.5     A:   NO acute issue P:   Monitro renal function  GASTROINTESTINAL A:   NPO unable to protect airway P:   NPO PPI  HEMATOLOGIC A:   Post TPA 1/24 P:  Monitor for bleeding  INFECTIOUS A:   Suspected aspiration P:   zoysn #0 BC x 2 1/25>> Sputum if intubated>>  ENDOCRINE CBG (last  3)   Recent Labs  05/31/16 2337 06/01/16 0335 06/01/16 0805  GLUCAP 217* 209* 192*     A:   DM P:   SSI  NEUROLOGIC A:   New onset rt side weakness 1/23, left subacute PCA infarct involving thalamus. 1/25 increased lethargy, inability to protect airway P:   RASS goal: 1-0 May need intubation   FAMILY  - Updates: Extended discussion with sister about course of care. There is a daughter in route, son is special needs, appears sister is OK with intubation but wants daughter involved in decision.  - Inter-disciplinary family meet or Palliative Care meeting due by:  day 7  CCT 60 min  Brett Canales Minor ACNP Adolph Pollack PCCM Pager [phone] till 3 pm If no answer page [phone] 06/01/2016, 9:45 AM CCT 60 min

## 2016-06-01 NOTE — Progress Notes (Signed)
Pt pulled cortrak out while tube feeding was running.   Pt was on room air but started desating to 89-90%.  O2 at 2 liter was started.  Throughout shift pt has been having increase work of breathing.  Pt lung sounds more wheezy throughout shift and pt is also diaphoretic.  MD notified NTS, arterial blood gas and chest x ray was ordered.  Will continue to monitor.

## 2016-06-01 NOTE — Progress Notes (Signed)
Initial Nutrition Assessment  DOCUMENTATION CODES:   Morbid obesity  INTERVENTION:   Vital High Protein @ 15 ml/hr (360 ml/day) 60 ml Prostat five times per day Provides: 1360 kcal, 181 grams protein, and 300 ml H2O.   NUTRITION DIAGNOSIS:   Inadequate oral intake related to inability to eat as evidenced by NPO status.  GOAL:   Provide needs based on ASPEN/SCCM guidelines  MONITOR:   TF tolerance, I & O's, Vent status  REASON FOR ASSESSMENT:   Consult Enteral/tube feeding initiation and management  ASSESSMENT:   61 Y/O with obesity, acute left basal ganglia infarct s/p TPA.    Pt discussed during ICU rounds and with RN.   1/25 Increased lethargy, intubated. Pulled out Cortrak tube this am with possible aspiration, OG placed Propofol @ 15.2 provides: 401 kcal  CBG's: 217-209-192 HgbA1C: 8.7 Per family no recent weight changes, good appetite PTA Nutrition-Focused physical exam completed. Findings are no fat depletion, no muscle depletion, and no edema.   Diet Order:  Diet NPO time specified  Skin:  Reviewed, no issues  Last BM:  unknown  Height:   Ht Readings from Last 1 Encounters:  2017-03-31 5' 8.9" (1.75 m)    Weight:   Wt Readings from Last 1 Encounters:  2017-03-31 279 lb 1.6 oz (126.6 kg)    Ideal Body Weight:  72.7 kg  BMI:  Body mass index is 41.34 kg/m.  Estimated Nutritional Needs:   Kcal:  [id number]  Protein:  >/= 181 grams  Fluid:  > 2 L/day  EDUCATION NEEDS:   No education needs identified at this time  Kendell BaneHeather Ken Bonn RD, LDN, CNSC [phone] Pager [phone] After Hours Pager

## 2016-06-01 NOTE — Progress Notes (Signed)
Noted intubation. I will follow at a distance for now. [phone]

## 2016-06-01 NOTE — Progress Notes (Addendum)
SLP Cancellation Note  Patient Details Name: Ryan LatinSamuel G Fountaine MRN: [id number] DOB: 01-16-1956   Cancelled treatment:       Reason Eval/Treat Not Completed: Medical issues which prohibited therapy. Chart reviewed and overnight events noted. Pt with increased trouble breathing and increased lethargy today. Will hold speech therapy this morning per RN.   Maxcine Hamaiewonsky, Latifah Padin 06/01/2016, 10:27 AM  Maxcine HamLaura Paiewonsky, M.A. CCC-SLP [phone]

## 2016-06-01 NOTE — Progress Notes (Signed)
Pt intubated earlier today due to increasing lethargy and inability to protect airway.  Pt with likely aspiration; started on IV Zosyn today.  Will continue to follow progress; CIR will continue to follow for medical stability.    Quintella BatonJulie W. Katelin Kutsch, RN, BSN  Trauma/Neuro ICU Case Manager [phone]

## 2016-06-02 ENCOUNTER — Inpatient Hospital Stay (HOSPITAL_COMMUNITY): Payer: Self-pay

## 2016-06-02 DIAGNOSIS — I639 Cerebral infarction, unspecified: Secondary | ICD-10-CM

## 2016-06-02 DIAGNOSIS — J9601 Acute respiratory failure with hypoxia: Secondary | ICD-10-CM

## 2016-06-02 LAB — BASIC METABOLIC PANEL
ANION GAP: 7 (ref 5–15)
BUN: 39 mg/dL — ABNORMAL HIGH (ref 6–20)
CHLORIDE: 110 mmol/L (ref 101–111)
CO2: 26 mmol/L (ref 22–32)
Calcium: 8.6 mg/dL — ABNORMAL LOW (ref 8.9–10.3)
Creatinine, Ser: 1.37 mg/dL — ABNORMAL HIGH (ref 0.61–1.24)
GFR, EST NON AFRICAN AMERICAN: 54 mL/min — AB (ref >60)
Glucose, Bld: 130 mg/dL — ABNORMAL HIGH (ref 65–99)
POTASSIUM: 3.5 mmol/L (ref 3.5–5.1)
Sodium: 143 mmol/L (ref 135–145)

## 2016-06-02 LAB — VALPROIC ACID LEVEL: VALPROIC ACID LVL: 69 ug/mL (ref 50.0–100.0)

## 2016-06-02 LAB — CBC
HCT: 43 % (ref 39.0–52.0)
Hemoglobin: 14.4 g/dL (ref 13.0–17.0)
MCH: 31.2 pg (ref 26.0–34.0)
MCHC: 33.5 g/dL (ref 30.0–36.0)
MCV: 93.3 fL (ref 78.0–100.0)
PLATELETS: 189 10*3/uL (ref 150–400)
RBC: 4.61 MIL/uL (ref 4.22–5.81)
RDW: 13.2 % (ref 11.5–15.5)
WBC: 13.6 10*3/uL — AB (ref 4.0–10.5)

## 2016-06-02 LAB — GLUCOSE, CAPILLARY
GLUCOSE-CAPILLARY: 127 mg/dL — AB (ref 65–99)
GLUCOSE-CAPILLARY: 144 mg/dL — AB (ref 65–99)
GLUCOSE-CAPILLARY: 136 mg/dL — AB (ref 65–99)
Glucose-Capillary: 143 mg/dL — ABNORMAL HIGH (ref 65–99)
Glucose-Capillary: 110 mg/dL — ABNORMAL HIGH (ref 65–99)
Glucose-Capillary: 177 mg/dL — ABNORMAL HIGH (ref 65–99)

## 2016-06-02 LAB — SODIUM
SODIUM: 144 mmol/L (ref 135–145)
SODIUM: 164 mmol/L — AB (ref 135–145)

## 2016-06-02 MED ORDER — SODIUM CHLORIDE 3 % IV SOLN
INTRAVENOUS | Status: DC
  Administered 2016-06-02 – 2016-06-03 (×4): 50 mL/h via INTRAVENOUS
  Filled 2016-06-02 (×15): qty 500

## 2016-06-02 MED ORDER — SODIUM CHLORIDE 0.9% FLUSH
10.0000 mL | Freq: Two times a day (BID) | INTRAVENOUS | Status: DC
  Administered 2016-06-02 – 2016-06-03 (×2): 10 mL
  Administered 2016-06-03: 20 mL
  Administered 2016-06-04 – 2016-06-07 (×6): 10 mL

## 2016-06-02 MED ORDER — SODIUM CHLORIDE 3 % IV SOLN: INTRAVENOUS | Status: DC

## 2016-06-02 MED ORDER — SODIUM CHLORIDE 0.9% FLUSH: 10.0000 mL | INTRAVENOUS | Status: DC | PRN

## 2016-06-02 NOTE — Progress Notes (Signed)
OT Cancellation and discharge  Note  Patient Details Name: Ryan Anderson MRN: [id number] DOB: February 08, 1956   Cancelled Treatment:    Reason Eval/Treat Not Completed: Medical issues which prohibited therapy.  Pt is now intubated and unresponsive, and no longer appropriate for OT.  OT will sign off at this time.  Please re-order once pt medically appropriate.  Calinda Stockinger Dickensonarpe, OTR/L [phone]   Jeani HawkingConarpe, Hector Venne M 06/02/2016, 12:52 PM

## 2016-06-02 NOTE — Progress Notes (Signed)
STROKE TEAM PROGRESS NOTE   HISTORY OF PRESENT ILLNESS (per record) Ryan Anderson is an 61 y.o. male with a history of hypertension and obesity, brought to the ED in code stroke status following acute onset of weakness involving right face and extremities along with slurred speech. Onset was at 9:45 PM. He has no previous history of stroke nor TIA. He was found to be in atrial fibrillation with RVR which had not previously been recognized. His blood pressure was markedly elevated and he had hyperglycemia in the 200s. 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 CT angiogram showed proximal left P2 PCA occlusion. There was no MCA occlusion. Patient was deemed a candidate for TPA which was administered after getting his blood pressure controlled which required Cardene drip after labetalol proved to be ineffective. There was worsening of his speech with onset of expressive aphasia during TPA infusion. TPA was suspended and repeat CT scan of the head was obtained which showed no signs of acute hemorrhage. TPA was resumed. Patient was not deemed a candidate for IR intervention, and was admitted to neuro ICU for further management.  LSN: 9:45 PM on 06/05/2016 tPA Given: Yes. tPA was delayed because of effort required to manage patient's hypertensive urgency. mRankin:   SUBJECTIVE (INTERVAL HISTORY) The patient's RN and family is the bedside. The patient was intubated y`day for  resp distress   with inability to protect his airway. Ct head showed Large left PCA infarct with swelling and petechial blood products with 5 mm left to right midline shift. EEG had shown diffuse slowing with runs of rhythmic spike and polyspike and wave activity in the left occipital region lasting 30 seconds. Patient was started on IV Depacon for seizures and level this morning is 69 mg percent. He has not had any witnessed seizure  activity. OBJECTIVE Temp:  [98.4 F (36.9 C)-100.3 F (37.9 C)] 98.4 F (36.9 C) (01/26 1200) Pulse Rate:  [42-100] 81 (01/26 1200) Cardiac Rhythm: Atrial fibrillation (01/26 1000) Resp:  [0-25] 18 (01/26 1200) BP: (105-193)/(60-128) 118/73 (01/26 1200) SpO2:  [95 %-100 %] 95 % (01/26 1200) FiO2 (%):  [30 %-40 %] 30 % (01/26 1200) Weight:  [265 lb 9.6 oz (120.5 kg)-265 lb 14.4 oz (120.6 kg)] 265 lb 9.6 oz (120.5 kg) (01/26 0403)  CBC:   Recent Labs Lab 05/08/2016 2315 05/28/2016 2323 06/02/16 0933  WBC 11.4*  --  13.6*  NEUTROABS 8.1*  --   --   HGB 16.9 17.0 14.4  HCT 47.6 50.0 43.0  MCV 90.0  --  93.3  PLT 225  --  189    Basic Metabolic Panel:   Recent Labs Lab 05/27/2016 2315 05/25/2016 2323 06/02/16 0933  NA 138 140 143  K 3.7 3.5 3.5  CL 101 100* 110  CO2 25  --  26  GLUCOSE 235* 237* 130*  BUN 17 21* 39*  CREATININE 1.26* 1.20 1.37*  CALCIUM 9.4  --  8.6*    Lipid Panel:     Component Value Date/Time   CHOL 159 05/30/2016 0340   TRIG 66 06/01/2016 1401   HDL 41 05/30/2016 0340   CHOLHDL 3.9 05/30/2016 0340   VLDL 12 05/30/2016 0340   LDLCALC 106 (H) 05/30/2016 0340   HgbA1c:  Lab Results  Component Value Date   HGBA1C 8.7 (H) 05/31/2016   Urine Drug Screen:  Component Value Date/Time   LABOPIA NONE DETECTED 06/25/16 2336   COCAINSCRNUR NONE DETECTED 06/25/2016 2336   LABBENZ NONE DETECTED 06/25/16 2336   AMPHETMU NONE DETECTED 06/25/2016 2336   THCU NONE DETECTED 06/25/2016 2336   LABBARB NONE DETECTED June 25, 2016 2336      IMAGING  Ct Angio Head and Neck W Or Wo Contrast 05/30/2016 1. Abrupt occlusion of the proximal left P2 segment.  2. No other large or proximal arterial branch occlusion identified.  3. Short-segment severe right A2 stenosis.  4. Multifocal atheromatous irregularity involving the distal MCA branches bilaterally.  5. Short-segment atheromatous stenosis of approximately 35% at the proximal left ICA.     Ct Head  Wo Contrast 05/30/2016 1. Similar appearance of evolving left MCA territory infarcts involving the left basal ganglia and probable anterior left frontal lobe.  2. Subtle loss of gray-white matter differentiation within the parasagittal left tempo-occipital region, suspicious for evolving left PCA territory infarct. This is compatible with previously identified left P2 occlusion.  3. No other new acute intracranial process identified.   CT head 06/01/16 Acute left PCA territory infarction. More low-density and swelling in the region involved. Petechial blood products are present, without frank focal hematoma. Increased swelling results in mass effect with left-to-right shift of 5 mm. No ventricular trapping.   Ct Head Code Stroke W/o Cm June 25, 2016 1. Acute evolving left basal ganglia infarct as above, with additional possible patchy involvement within the supra ganglionic anterior left frontal lobe.  2. ASPECTS is 6  3. Remote right basal ganglia infarct.  4. Mild chronic small vessel ischemic disease.    MRI Brain W/o Contrast - 1. Moderately motion degraded examination. Acute large LEFT PCA territory infarct including LEFT thalamus with petechial hemorrhage. 2. Subacute LEFT MCA territory infarct involving LEFT basal ganglia and LEFT frontal lobe with petechial hemorrhage. 3. Old RIGHT basal ganglia infarct. 4. Mild chronic small vessel ischemic disease. Mild to moderate parenchymal brain volume loss.  EEG 06/01/16. Diffuse slowing with runs of rhythmic spikes and polyspike wave activity in the left occipital region lasting 30 seconds which may represent seizure activity  PHYSICAL EXAM  Obese middle aged male not in distress. . Afebrile. Head is nontraumatic. Neck is supple without bruit.    Cardiac exam no murmur or gallop. Lungs are clear to auscultation. Distal pulses are well felt.  Neurological Exam :  stuporose arouses with difficulty. Right pupil is slightly larger 4 mm left  is 3 mm but both are reactive. There is no ptosis Doll1s eye movements are sluggish Fundi were not visualized. Unable to follow any commands. Right hemiplegia with grade 1/5 strength in the right upper extremity with tapered to been on his fingers. Grade 2/5 strength in the right lower extremity. Purposeful antigravity strength on the left without weakness. Sensation appears diminished on the right compared to the left. Right plantar is upgoing left is downgoing. Reflexes are depressed on the right. Gait was not tested.    ASSESSMENT/PLAN Ryan Anderson is a 61 y.o. male with history of hypertension, depression, new onset atrial fibrillation without anticoagulation, hyperglycemia, and obesity presenting with dysarthria with right sided weakness.  He received IV t-PA Monday, 06-25-2016, at 2330.   Stroke:  Dominant infarct embolic secondary to atrial fibrillation.  Resultant  Right hemiparesis and field cut  MRI - motion degraded. Large left PCA infarct and subacute left MCA and basal ganglia and frontal lobe infarcts. Old right basal ganglia infarct. MRA - not performed  CTA H&N - Abrupt occlusion of the proximal left P2 segment. Short-segment severe right A2 stenosis.  Carotid Doppler - see  CTA neck 2D Echo - - Left ventricle: The cavity size was normal. Wall thickness was   increased in a pattern of moderate LVH. Systolic function was   normal. The estimated ejection fraction was in the range of 55%    to 60%. Wall motion was normal;   LDL - 106  HgbA1c - 8.8   VTE prophylaxis - SCDs Diet NPO time specified  No antithrombotic prior to admission, now on No antithrombotic secondary to t-PA therapy  Ongoing aggressive stroke risk factor management  Therapy recommendations: pending  Disposition:  Pending  Hypertension  Stable - Cardene drip  Permissive hypertension (OK if < 220/120) but gradually normalize in 5-7 days  Long-term BP goal  normotensive   Hyperlipidemia  Home meds:  No lipid lowering medications prior to admission.  LDL 106, goal < 70  Add statin when PO access is available  Continue statin at discharge  Cytotoxic Cerebral edema  3% saline started 06/01/16 Na goal 150-155    Other Stroke Risk Factors  Advanced age  ETOH use, advised to drink no more than 1 drink per day  Obesity, Body mass index is 39.34 kg/m., recommend weight loss, diet and exercise as appropriate   Hx stroke/TIA - remote basal ganglia infarct by imaging  New onset atrial fibrillation  Other Active Problems  New-onset atrial fibrillation - appreciate cardiology consult - will need anticoagulation when appropriate.  Elevated glucose levels  Mild leukocytosis (afebrile)  Short-segment severe right A2 stenosis.   New worsening mental status and resp distress 06/01/16 ? Aspiration pneumonia versus neuro change Plan repeat head Ct today , check EEG for seizures and PCCM consult for possible intubation  EEG spikes and wave discharges 06/01/16 probably seizures  Cytotoxic cerebral edema    Hospital day # 3  Delton See PA-C [facility] Pager [phone] 06/02/2016, 12:48 PM I have personally examined this patient, reviewed notes, independently viewed imaging studies, participated in medical decision making and plan of care.ROS completed by me personally and pertinent positives fully documented  I have made any additions or clarifications directly to the above note. Agree with note above.   Continue ongoing close neurological monitoring and strict blood pressure control.Hold anticoagulation for now due to hemorrhagic transformation. EEG had shown electrographic seizures and patient started on IV Depacon with level in therapeutic range this morning. Start hypertonic saline for cerebral edema and sodium goal 150-155..I spoke to sister , daughter and her   son at bedside and gave update and answered  questions.Patient`s condition has declined and prognosis is guarded and family will need to decide on prolonged ventilatory support. Trach and PEG in next few days .They are realistic and agree on DNR but would like to continue support for next few days and see his response. This patient is critically ill and at significant risk of neurological worsening, death and care requires constant monitoring of vital signs, hemodynamics,respiratory and cardiac monitoring, extensive review of multiple databases, frequent neurological assessment, discussion with family, other specialists and medical decision making of high complexity.I have made any additions or clarifications directly to the above note.This critical care time does not reflect procedure time, or teaching time or supervisory time of PA/NP/Med Resident etc but could involve care discussion time.  I spent 45 minutes of neurocritical care time  in the care of  this patient.  Delia Heady, MD Medical Director [facility] Pager: [phone] 06/02/2016 9:46 AM   To contact Stroke Continuity provider, please refer to [email or web address]. After hours, contact General Neurology

## 2016-06-02 NOTE — Progress Notes (Signed)
Family had discussion per Dr. Marlis EdelsonSethi's request about making patient DNR. They have all decided to make him DNR at this time. Family seems appropriate and accepting of the situation.

## 2016-06-02 NOTE — Progress Notes (Signed)
Peripherally Inserted Central Catheter/Midline Placement  The IV Nurse has discussed with the patient and/or persons authorized to consent for the patient, the purpose of this procedure and the potential benefits and risks involved with this procedure.  The benefits include less needle sticks, lab draws from the catheter, and the patient may be discharged home with the catheter. Risks include, but not limited to, infection, bleeding, blood clot (thrombus formation), and puncture of an artery; nerve damage and irregular heartbeat and possibility to perform a PICC exchange if needed/ordered by physician.  Alternatives to this procedure were also discussed.  Bard Power PICC patient education guide, fact sheet on infection prevention and patient information card has been provided to patient /or left at bedside.    PICC/Midline Placement Documentation    Information given to daughter who consented for patient.    Franne Gripewman, Clancy Mullarkey Renee 06/02/2016, 3:04 PM

## 2016-06-02 NOTE — Progress Notes (Signed)
Progress Note  Patient Name: Ryan Anderson Date of Encounter: 06/02/2016  Primary Cardiologist: New. Dr. Martinique  Subjective   Worsening neurologic yesterday. Now intubated.  Inpatient Medications    Scheduled Meds: . aspirin  325 mg Per Tube Daily  . chlorhexidine gluconate (MEDLINE KIT)  15 mL Mouth Rinse BID  . feeding supplement (PRO-STAT SUGAR FREE 64)  60 mL Per Tube 5 X Daily  . insulin aspart  2-6 Units Subcutaneous Q4H  . mouth rinse  15 mL Mouth Rinse 10 times per day  . metoprolol tartrate  25 mg Oral BID  . pantoprazole sodium  40 mg Per Tube Daily  . piperacillin-tazobactam (ZOSYN)  IV  3.375 g Intravenous Q8H  . valproic acid (DEPACON) IVPB  750 mg Intravenous TID   Continuous Infusions: . sodium chloride 75 mL/hr at 06/02/16 1000  . diltiazem (CARDIZEM) infusion    . feeding supplement (VITAL HIGH PROTEIN) 1,000 mL (06/02/16 1000)  . niCARDipine Stopped (05/31/16 1019)  . propofol (DIPRIVAN) infusion 20 mcg/kg/min (06/02/16 1000)  . sodium chloride (hypertonic)     PRN Meds: acetaminophen **OR** acetaminophen (TYLENOL) oral liquid 160 mg/5 mL **OR** acetaminophen, labetalol, metoprolol   Vital Signs    Vitals:   06/02/16 0750 06/02/16 0800 06/02/16 0900 06/02/16 1000  BP: 133/74 (!) 159/91 131/84 130/86  Pulse: 89 75 88 62  Resp: (!) 25 (!) 0 (!) 0 (!) 23  Temp:      TempSrc:      SpO2: 95% 96% 97% 96%  Weight:      Height:        Intake/Output Summary (Last 24 hours) at 06/02/16 1052 Last data filed at 06/02/16 1002  Gross per 24 hour  Intake          2740.88 ml  Output             1325 ml  Net          1415.88 ml   Filed Weights   05/28/2016 2342 06/01/16 1959 06/02/16 0403  Weight: 279 lb 1.6 oz (126.6 kg) 265 lb 14.4 oz (120.6 kg) 265 lb 9.6 oz (120.5 kg)    Telemetry    Recurrent Afib with rate 70s. - Personally Reviewed  ECG    05/30/16: NSR with diffuse T wave inversion - Personally Reviewed  Physical Exam    GEN:  unresponsive on vent  Neck: No JVD Cardiac: IRRR, no murmurs, rubs, or gallops.  Respiratory: Coarse BS with rhonchi GI: Soft, nontender, non-distended  MS: No edema; No deformity. Neuro:  decreased responsiveness   Labs    Chemistry  Recent Labs Lab 06/06/2016 2315 05/16/2016 2323 06/02/16 0933  NA 138 140 143  K 3.7 3.5 3.5  CL 101 100* 110  CO2 25  --  26  GLUCOSE 235* 237* 130*  BUN 17 21* 39*  CREATININE 1.26* 1.20 1.37*  CALCIUM 9.4  --  8.6*  PROT 7.3  --   --   ALBUMIN 3.7  --   --   AST 37  --   --   ALT 31  --   --   ALKPHOS 103  --   --   BILITOT 0.7  --   --   GFRNONAA 59*  --  54*  GFRAA >60  --  >60  ANIONGAP 12  --  7     Hematology  Recent Labs Lab 06/01/2016 2315 06/06/2016 2323 06/02/16 0933  WBC 11.4*  --  13.6*  RBC 5.29  --  4.61  HGB 16.9 17.0 14.4  HCT 47.6 50.0 43.0  MCV 90.0  --  93.3  MCH 31.9  --  31.2  MCHC 35.5  --  33.5  RDW 12.7  --  13.2  PLT 225  --  189    Cardiac EnzymesNo results for input(s): TROPONINI in the last 168 hours.   Recent Labs Lab 05/31/2016 2321  TROPIPOC 0.03     BNPNo results for input(s): BNP, PROBNP in the last 168 hours.   DDimer No results for input(s): DDIMER in the last 168 hours.   Radiology    Ct Head Wo Contrast  Result Date: 06/01/2016 CLINICAL DATA:  Followup left PCA territory acute infarction. EXAM: CT HEAD WITHOUT CONTRAST TECHNIQUE: Contiguous axial images were obtained from the base of the skull through the vertex without intravenous contrast. COMPARISON:  05/30/2016 FINDINGS: Brain: More extensive low-density and swelling throughout the left PCA territory infarction affecting the posteromedial temporal lobe, occipital lobe, thalamus and splenium of the corpus callosum on the left. There are some petechial blood products but no frank hematoma. Swelling with mild mass effect and left-to-right shift of 5 mm. No new infarction is seen. Old infarction in the right basal ganglia/ radiating  white matter tracts. No hydrocephalus. No extra-axial collection. Vascular: No significant vascular finding. Skull: Negative Sinuses/Orbits: Mucosal inflammatory disease the paranasal sinuses. Other: None significant IMPRESSION: Acute left PCA territory infarction. More low-density and swelling in the region involved. Petechial blood products are present, without frank focal hematoma. Increased swelling results in mass effect with left-to-right shift of 5 mm. No ventricular trapping. Electronically Signed   By: Nelson Chimes M.D.   On: 06/01/2016 16:24   Dg Chest Port 1 View  Result Date: 06/01/2016 CLINICAL DATA:  Intubation. EXAM: PORTABLE CHEST 1 VIEW COMPARISON:  06/01/2016. FINDINGS: Endotracheal tube noted with tip 2.7 cm above the carina . Cardiomegaly with mild pulmonary vascular prominence. No focal infiltrate. No pleural effusion or pneumothorax. IMPRESSION: 1. Endotracheal tube noted with tip 2.7 cm above the carina. 2.  Cardiomegaly.  Mild bibasilar atelectasis and/or infiltrates. Electronically Signed   By: Marcello Moores  Register   On: 06/01/2016 12:16   Dg Chest Port 1 View  Result Date: 06/01/2016 CLINICAL DATA:  Acute onset of respiratory distress after possible aspiration. Initial encounter. EXAM: PORTABLE CHEST 1 VIEW COMPARISON:  CT of the chest performed 02/13/2005 FINDINGS: The lungs are well-aerated. Vascular congestion is noted. Increased interstitial markings raise concern for pulmonary edema. There is no evidence of pleural effusion or pneumothorax. The cardiomediastinal silhouette is mildly enlarged. No acute osseous abnormalities are seen. IMPRESSION: Vascular congestion and mild cardiomegaly. Increased interstitial markings raise concern for pulmonary edema. The appearance is less typical for aspiration pneumonia. Electronically Signed   By: Garald Balding M.D.   On: 06/01/2016 01:43   Dg Abd Portable 1v  Result Date: 06/01/2016 CLINICAL DATA:  Orogastric tube placement. EXAM:  PORTABLE ABDOMEN - 1 VIEW COMPARISON:  06/01/2016 . FINDINGS: Endotracheal tube noted in stable position. Orogastric tube noted with tip projected stomach. No bowel distention. No acute bony abnormality . IMPRESSION: Interim placement of OG tube. Its tip is projected over the stomach. No bowel distention. Endotracheal tube in stable position. Electronically Signed   By: Marcello Moores  Register   On: 06/01/2016 12:30   Dg Abd Portable 1v  Result Date: 05/31/2016 CLINICAL DATA:  Evaluate feeding tube placement EXAM: PORTABLE ABDOMEN - 1 VIEW COMPARISON:  None FINDINGS:  The feeding tube tip is in the distal stomach. No dilated bowel loops identified. IMPRESSION: 1. Feeding tube tip is in the distal stomach. Electronically Signed   By: Kerby Moors M.D.   On: 05/31/2016 16:08    Cardiac Studies   Echo: 05/30/16: Study Conclusions  - Left ventricle: The cavity size was normal. Wall thickness was   increased in a pattern of moderate LVH. Systolic function was   normal. The estimated ejection fraction was in the range of 55%   to 60%. Wall motion was normal; there were no regional wall   motion abnormalities. Doppler parameters are consistent with   restrictive physiology, indicative of decreased left ventricular   diastolic compliance and/or increased left atrial pressure. - Mitral valve: Calcified annulus. - Left atrium: The atrium was severely dilated.  Impressions:  - Technically difficult; definity used; normal LV systolic   function; moderate LVH; restrictive filling severe LAE.  Patient Profile     61 y.o. male presented to the ED on 05/16/2016 with right sided paralysis, facial droop and slurred speech. 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 CT angiogram showed proximal left P2 PCA occlusion. There was no MCA occlusion.   He was in atrial fibrillation with RVR with rate on 111 on  arrival to the ED. He was given TPA (which was paused for a short time as the patient developed aphasia) and transferred to 28M. He has no prior history of atrial fibrillation. He was markedly hypertensive and started on a cardene gtt.  Assessment & Plan    1. Atrial fibrillation with RVR.  Rate currently well controlled on oral metoprolol. Adjust oral meds as needed. No anticoagulation now given findings on CT yesterday. 2. HTN - well controlled currently. 3. Diastolic dysfunction by Echo. 4. Acute Left PCA and left MCA territory  Infarct c/w embolic event. Neuro status decline. Repeat CT head shows swelling with mild shift and some punctate areas of hemorrhage.  5. Hypercholesterolemia. Recommend statin therapy  Patient is stable from a cardiac standpoint and we having nothing further to add. Will sign off- please call with questions.  Signed, Peter Martinique, MD  06/02/2016, 10:52 AM

## 2016-06-02 NOTE — Progress Notes (Signed)
PULMONARY / CRITICAL CARE MEDICINE   Name: Ryan Anderson MRN: [id number] DOB: [birth year]    ADMISSION DATE:  06/17/2016 CONSULTATION DATE: 1/25  REFERRING MD: Neuro  CHIEF COMPLAINT: Stroke  HISTORY OF PRESENT ILLNESS:   MO(279 lbs) male who was admitted 1/24 with right sided weakness, new onset of Afib, who went to CT/MRI and was diagnosed with new left subacute PCA infarct involving thalamus. 1/25 he developed increasing lethargy and dysarthria with inability to protect his airway. PCCM called to bedside for increasing resp distress and inability to protect his airway. He may well need intubation   REVIEW OF SYSTEMS:   NA  SUBJECTIVE:  Now intubated, for 3% saline start poor prognosis.  VITAL SIGNS: BP (!) 159/91   Pulse 75   Temp 100.3 F (37.9 C) (Oral)   Resp (!) 0   Ht 5' 8.9" (1.75 m)   Wt 265 lb 9.6 oz (120.5 kg)   SpO2 96%   BMI 39.34 kg/m   HEMODYNAMICS:    VENTILATOR SETTINGS: Vent Mode: PSV;CPAP FiO2 (%):  [30 %-100 %] 30 % Set Rate:  [16 bmp] 16 bmp Vt Set:  [570 mL] 570 mL PEEP:  [5 cmH20] 5 cmH20 Pressure Support:  [8 cmH20] 8 cmH20 Plateau Pressure:  [14 cmH20-16 cmH20] 16 cmH20  INTAKE / OUTPUT: I/O last 3 completed shifts: In: 3692.8 [I.V.:2651.3; NG/GT:461.5; IV Piggyback:580] Out: 2225 [Urine:2225]  PHYSICAL EXAMINATION: General: MOWM sedated on vent Neuro: Sedated on vent HEENT:  No JVD/LAN, rt downward gaze, perl 4mm Cardiovascular:  HSIR IR Lungs:  Congested gurgling breat sounds Abdomen: Obese + bs Musculoskeletal: Intact Skin:warm and dry  LABS:  BMET  Recent Labs Lab 06-17-2016 2315 Jun 17, 2016 2323  NA 138 140  K 3.7 3.5  CL 101 100*  CO2 25  --   BUN 17 21*  CREATININE 1.26* 1.20  GLUCOSE 235* 237*    Electrolytes  Recent Labs Lab 17-Jun-2016 2315  CALCIUM 9.4    CBC  Recent Labs Lab Jun 17, 2016 2315 Jun 17, 2016 2323  WBC 11.4*  --   HGB 16.9 17.0  HCT 47.6 50.0  PLT 225  --     Coag's  Recent Labs Lab  Jun 17, 2016 2315  APTT 30  INR 0.95    Sepsis Markers No results for input(s): LATICACIDVEN, PROCALCITON, O2SATVEN in the last 168 hours.  ABG  Recent Labs Lab 06/01/16 0115 06/01/16 1255  PHART 7.444 7.345*  PCO2ART 38.2 49.7*  PO2ART 94.7 319*    Liver Enzymes  Recent Labs Lab 17-Jun-2016 2315  AST 37  ALT 31  ALKPHOS 103  BILITOT 0.7  ALBUMIN 3.7    Cardiac Enzymes No results for input(s): TROPONINI, PROBNP in the last 168 hours.  Glucose  Recent Labs Lab 06/01/16 1155 06/01/16 1656 06/01/16 1958 06/01/16 2340 06/02/16 0358 06/02/16 0753  GLUCAP 198* 154* 162* 140* 127* 143*    Imaging Ct Head Wo Contrast  Result Date: 06/01/2016 CLINICAL DATA:  Followup left PCA territory acute infarction. EXAM: CT HEAD WITHOUT CONTRAST TECHNIQUE: Contiguous axial images were obtained from the base of the skull through the vertex without intravenous contrast. COMPARISON:  05/30/2016 FINDINGS: Brain: More extensive low-density and swelling throughout the left PCA territory infarction affecting the posteromedial temporal lobe, occipital lobe, thalamus and splenium of the corpus callosum on the left. There are some petechial blood products but no frank hematoma. Swelling with mild mass effect and left-to-right shift of 5 mm. No new infarction is seen. Old infarction in the right  basal ganglia/ radiating white matter tracts. No hydrocephalus. No extra-axial collection. Vascular: No significant vascular finding. Skull: Negative Sinuses/Orbits: Mucosal inflammatory disease the paranasal sinuses. Other: None significant IMPRESSION: Acute left PCA territory infarction. More low-density and swelling in the region involved. Petechial blood products are present, without frank focal hematoma. Increased swelling results in mass effect with left-to-right shift of 5 mm. No ventricular trapping. Electronically Signed   By: Paulina Fusi M.D.   On: 06/01/2016 16:24   Dg Chest Port 1 View  Result  Date: 06/01/2016 CLINICAL DATA:  Intubation. EXAM: PORTABLE CHEST 1 VIEW COMPARISON:  06/01/2016. FINDINGS: Endotracheal tube noted with tip 2.7 cm above the carina . Cardiomegaly with mild pulmonary vascular prominence. No focal infiltrate. No pleural effusion or pneumothorax. IMPRESSION: 1. Endotracheal tube noted with tip 2.7 cm above the carina. 2.  Cardiomegaly.  Mild bibasilar atelectasis and/or infiltrates. Electronically Signed   By: Maisie Fus  Register   On: 06/01/2016 12:16   Dg Abd Portable 1v  Result Date: 06/01/2016 CLINICAL DATA:  Orogastric tube placement. EXAM: PORTABLE ABDOMEN - 1 VIEW COMPARISON:  06/01/2016 . FINDINGS: Endotracheal tube noted in stable position. Orogastric tube noted with tip projected stomach. No bowel distention. No acute bony abnormality . IMPRESSION: Interim placement of OG tube. Its tip is projected over the stomach. No bowel distention. Endotracheal tube in stable position. Electronically Signed   By: Maisie Fus  Register   On: 06/01/2016 12:30     STUDIES:  CT head 1/25 with extension of stroke  CULTURES: 1/25 bc x 2>> 1/25 sputum>>  ANTIBIOTICS: 1/25 zoysn>>  SIGNIFICANT EVENTS:   LINES/TUBES: 1/25 ET>>  DISCUSSION: MO(279 lbs) male who was admitted 1/24 with right sided weakness, new onset of Afib, who went to CT/MRI and was diagnosed with new left subacute PCA infarct involving thalamus. 1/25 he developed increasing lethargy and dysarthria with inability to protect his airway. PCCM called to bedside for increasing resp distress and inability to protect his airway. He may well need intubation. Intubated 1/25, CT scan 1/25 with extension. Poor prognosis.   ASSESSMENT / PLAN:  PULMONARY A: Inability to protect airway post embolic stroke Suspected OSA Suspected ongoing aspiration P:   Intubated 1/25 Vent  bundle  CARDIOVASCULAR A:  New onset Afib HTN P:  Rate control per cards No anticoagulation due blood on CT head scan  RENAL Lab  Results  Component Value Date   CREATININE 1.20 06/11/16   CREATININE 1.26 (H) June 11, 2016   CREATININE 1.1 01/12/2012    Recent Labs Lab 11-Jun-2016 2315 11-Jun-2016 2323  K 3.7 3.5     A:   NO acute issue P:   Monitro renal function  GASTROINTESTINAL A:   NPO unable to protect airway P:   NPO PPI TF  HEMATOLOGIC A:   Post TPA 1/24 P:  Monitor for bleeding  INFECTIOUS A:   Suspected aspiration P:   zoysn #1 BC x 2 1/25>> Sputum 1/25 >>  ENDOCRINE CBG (last 3)   Recent Labs  06/01/16 2340 06/02/16 0358 06/02/16 0753  GLUCAP 140* 127* 143*     A:   DM P:   SSI  NEUROLOGIC A:   New onset rt side weakness 1/23, left subacute PCA infarct involving thalamus. 1/25 increased lethargy, inability to protect airway CT scan worse 1/25, poor prognosis  P:   RASS goal: 1-0 Intubated to protect airway. Sedate as needed for ET tolerance  1/26 start 3% saline per neuro.   FAMILY  - Updates: Extended discussion  with sister about course of care. There is a daughter in route, son is special needs, appears sister is OK with intubation but wants daughter involved in decision. 1/26 updated sister at bedside. He is now intubated.  - Inter-disciplinary family meet or Palliative Care meeting due by:  day 7  CCT 30 min  Brett CanalesSteve Dorna Mallet ACNP [facility] PollackLe Bauer PCCM Pager [phone] till 3 pm If no answer page [phone] 06/02/2016, 9:25 AM CCT 60 min

## 2016-06-02 NOTE — Progress Notes (Signed)
SLP Cancellation Note  Patient Details Name: Ryan Anderson MRN: [id number] DOB: Oct 19, 1955   Cancelled treatment:       Reason Eval/Treat Not Completed: Medical issues which prohibited therapy. Remains intubated.   Maxcine Hamaiewonsky, Clariece Roesler 06/02/2016, 10:10 AM  Maxcine HamLaura Paiewonsky, M.A. CCC-SLP [phone]

## 2016-06-02 NOTE — Progress Notes (Signed)
PT Cancellation and Discharge Note  Patient Details Name: Ryan Anderson MRN: [id number] DOB: 1956/01/07   Cancelled Treatment:    Reason Eval/Treat Not Completed: Medical issues which prohibited therapy.  Pt with decline and spoke with RN who indicates pt is not appropriate for PT and mobility at this time.  Will sign off and need new order once appropriate.     Alison MurrayMegan F Karilynn Carranza 06/02/2016, 10:41 AM

## 2016-06-03 ENCOUNTER — Inpatient Hospital Stay (HOSPITAL_COMMUNITY): Payer: Self-pay

## 2016-06-03 DIAGNOSIS — E785 Hyperlipidemia, unspecified: Secondary | ICD-10-CM

## 2016-06-03 DIAGNOSIS — R9401 Abnormal electroencephalogram [EEG]: Secondary | ICD-10-CM

## 2016-06-03 DIAGNOSIS — E1159 Type 2 diabetes mellitus with other circulatory complications: Secondary | ICD-10-CM

## 2016-06-03 LAB — BASIC METABOLIC PANEL
ANION GAP: 8 (ref 5–15)
BUN: 34 mg/dL — AB (ref 6–20)
CALCIUM: 8.5 mg/dL — AB (ref 8.9–10.3)
CO2: 27 mmol/L (ref 22–32)
CREATININE: 1.06 mg/dL (ref 0.61–1.24)
Chloride: 114 mmol/L — ABNORMAL HIGH (ref 101–111)
GFR calc Af Amer: >60 mL/min (ref >60)
Glucose, Bld: 170 mg/dL — ABNORMAL HIGH (ref 65–99)
Potassium: 3.4 mmol/L — ABNORMAL LOW (ref 3.5–5.1)
Sodium: 149 mmol/L — ABNORMAL HIGH (ref 135–145)

## 2016-06-03 LAB — GLUCOSE, CAPILLARY
GLUCOSE-CAPILLARY: 159 mg/dL — AB (ref 65–99)
GLUCOSE-CAPILLARY: 214 mg/dL — AB (ref 65–99)
GLUCOSE-CAPILLARY: 179 mg/dL — AB (ref 65–99)
Glucose-Capillary: 189 mg/dL — ABNORMAL HIGH (ref 65–99)
Glucose-Capillary: 184 mg/dL — ABNORMAL HIGH (ref 65–99)
Glucose-Capillary: 219 mg/dL — ABNORMAL HIGH (ref 65–99)

## 2016-06-03 LAB — CBC
HEMATOCRIT: 44.6 % (ref 39.0–52.0)
Hemoglobin: 14.8 g/dL (ref 13.0–17.0)
MCH: 31.4 pg (ref 26.0–34.0)
MCHC: 33.2 g/dL (ref 30.0–36.0)
MCV: 94.5 fL (ref 78.0–100.0)
PLATELETS: 161 10*3/uL (ref 150–400)
RBC: 4.72 MIL/uL (ref 4.22–5.81)
RDW: 13.1 % (ref 11.5–15.5)
WBC: 15.7 10*3/uL — ABNORMAL HIGH (ref 4.0–10.5)

## 2016-06-03 LAB — VALPROIC ACID LEVEL: Valproic Acid Lvl: 71 ug/mL (ref 50.0–100.0)

## 2016-06-03 LAB — SODIUM
SODIUM: 152 mmol/L — AB (ref 135–145)
Sodium: 146 mmol/L — ABNORMAL HIGH (ref 135–145)
Sodium: 150 mmol/L — ABNORMAL HIGH (ref 135–145)
Sodium: 149 mmol/L — ABNORMAL HIGH (ref 135–145)
Sodium: 155 mmol/L — ABNORMAL HIGH (ref 135–145)

## 2016-06-03 MED ORDER — POTASSIUM CHLORIDE 20 MEQ/15ML (10%) PO SOLN
40.0000 meq | Freq: Once | ORAL | Status: AC
  Administered 2016-06-03: 40 meq
  Filled 2016-06-03: qty 30

## 2016-06-03 MED ORDER — ATORVASTATIN CALCIUM 40 MG PO TABS
40.0000 mg | ORAL_TABLET | Freq: Every day | ORAL | Status: DC
  Administered 2016-06-03 – 2016-06-06 (×4): 40 mg via ORAL
  Filled 2016-06-03 (×4): qty 1

## 2016-06-03 MED ORDER — IPRATROPIUM-ALBUTEROL 0.5-2.5 (3) MG/3ML IN SOLN
RESPIRATORY_TRACT | Status: AC
  Filled 2016-06-03: qty 3

## 2016-06-03 MED ORDER — ENOXAPARIN SODIUM 40 MG/0.4ML ~~LOC~~ SOLN
40.0000 mg | SUBCUTANEOUS | Status: DC
  Administered 2016-06-03 – 2016-06-07 (×5): 40 mg via SUBCUTANEOUS
  Filled 2016-06-03 (×5): qty 0.4

## 2016-06-03 MED ORDER — METOPROLOL TARTRATE 50 MG PO TABS
50.0000 mg | ORAL_TABLET | Freq: Two times a day (BID) | ORAL | Status: DC
  Administered 2016-06-03 – 2016-06-05 (×5): 50 mg via ORAL
  Filled 2016-06-03 (×6): qty 1

## 2016-06-03 MED ORDER — NICARDIPINE HCL IN NACL 40-0.83 MG/200ML-% IV SOLN
3.0000 mg/h | INTRAVENOUS | Status: DC
Start: 2016-06-03 — End: 2016-06-07
  Administered 2016-06-03: 5 mg/h via INTRAVENOUS
  Administered 2016-06-03: 7 mg/h via INTRAVENOUS
  Administered 2016-06-03: 5 mg/h via INTRAVENOUS
  Administered 2016-06-04: 7 mg/h via INTRAVENOUS
  Filled 2016-06-03 (×4): qty 200

## 2016-06-03 MED ORDER — INSULIN GLARGINE 100 UNIT/ML ~~LOC~~ SOLN
10.0000 [IU] | Freq: Every day | SUBCUTANEOUS | Status: DC
  Administered 2016-06-03 – 2016-06-04 (×2): 10 [IU] via SUBCUTANEOUS
  Filled 2016-06-03 (×2): qty 0.1

## 2016-06-03 MED ORDER — LISINOPRIL 20 MG PO TABS
20.0000 mg | ORAL_TABLET | Freq: Every day | ORAL | Status: DC
  Administered 2016-06-03: 20 mg via ORAL
  Filled 2016-06-03: qty 1

## 2016-06-03 NOTE — Procedures (Signed)
Electroencephalogram (EEG) Report  Date of study: 06/03/16  Requesting clinician: Rosalin Hawking, MD  Reason for study: Evaluate for seizure  Brief clinical history: This is a 61 year old man with acute ischemic stroke. He has remained intubated and comes agitated when sedation is reduced. Previously on his hospitalization he had an abnormal EEG with rhythmic spikes and polyspike-wave activity in the left occipital region. EEG is now being repeated for further evaluation.  Medications:  Current Facility-Administered Medications:  .  acetaminophen (TYLENOL) tablet 650 mg, 650 mg, Oral, Q4H PRN **OR** acetaminophen (TYLENOL) solution 650 mg, 650 mg, Per Tube, Q4H PRN **OR** acetaminophen (TYLENOL) suppository 650 mg, 650 mg, Rectal, Q4H PRN, Wallie Char .  aspirin tablet 325 mg, 325 mg, Per Tube, Daily, Early Chars Rinehuls, PA-C, 325 mg at 06/03/16 0933 .  atorvastatin (LIPITOR) tablet 40 mg, 40 mg, Oral, q1800, Rosalin Hawking, MD .  chlorhexidine gluconate (MEDLINE KIT) (PERIDEX) 0.12 % solution 15 mL, 15 mL, Mouth Rinse, BID, Garvin Fila, MD, 15 mL at 06/03/16 0900 .  enoxaparin (LOVENOX) injection 40 mg, 40 mg, Subcutaneous, Q24H, Rosalin Hawking, MD, 40 mg at 06/03/16 1148 .  feeding supplement (PRO-STAT SUGAR FREE 64) liquid 60 mL, 60 mL, Per Tube, 5 X Daily, Garvin Fila, MD, 60 mL at 06/03/16 0933 .  feeding supplement (VITAL HIGH PROTEIN) liquid 1,000 mL, 1,000 mL, Per Tube, Continuous, Garvin Fila, MD, Last Rate: 15 mL/hr at 06/03/16 1206, 1,000 mL at 06/03/16 1206 .  insulin aspart (novoLOG) injection 2-6 Units, 2-6 Units, Subcutaneous, Q4H, Darrel Reach, MD, 4 Units at 06/03/16 1146 .  insulin glargine (LANTUS) injection 10 Units, 10 Units, Subcutaneous, Daily, Rosalin Hawking, MD, 10 Units at 06/03/16 1151 .  labetalol (NORMODYNE,TRANDATE) injection 20 mg, 20 mg, Intravenous, Q10 min PRN, Early Chars Rinehuls, PA-C, 20 mg at 06/03/16 0228 .  lisinopril (PRINIVIL,ZESTRIL) tablet 20 mg, 20  mg, Oral, Daily, Rosalin Hawking, MD, 20 mg at 06/03/16 1148 .  MEDLINE mouth rinse, 15 mL, Mouth Rinse, 10 times per day, Garvin Fila, MD, 15 mL at 06/03/16 1159 .  metoprolol (LOPRESSOR) injection 5 mg, 5 mg, Intravenous, Q6H PRN, Erma Heritage, PA, 5 mg at 06/01/16 0439 .  metoprolol (LOPRESSOR) tablet 50 mg, 50 mg, Oral, BID, Rosalin Hawking, MD .  nicardipine (CARDENE) 30m in 0.83% saline 2070mIV DOUBLE STRENGTH infusion (0.2 mg/ml), 3-15 mg/hr, Intravenous, Continuous, JiRosalin HawkingMD, Last Rate: 25 mL/hr at 06/03/16 1000, 5 mg/hr at 06/03/16 1000 .  pantoprazole sodium (PROTONIX) 40 mg/20 mL oral suspension 40 mg, 40 mg, Per Tube, Daily, David L Rinehuls, PA-C, 40 mg at 06/03/16 0933 .  piperacillin-tazobactam (ZOSYN) IVPB 3.375 g, 3.375 g, Intravenous, Q8H, ChWallie Char3.375 g at 06/03/16 1100 .  propofol (DIPRIVAN) 1000 MG/100ML infusion, 5-80 mcg/kg/min, Intravenous, Titrated, Praveen Mannam, MD, Last Rate: 22.8 mL/hr at 06/03/16 1149, 30 mcg/kg/min at 06/03/16 1149 .  sodium chloride (hypertonic) 3 % solution, , Intravenous, Continuous, PrGarvin FilaMD, Last Rate: 50 mL/hr at 06/03/16 1000 .  sodium chloride flush (NS) 0.9 % injection 10-40 mL, 10-40 mL, Intracatheter, Q12H, PrGarvin FilaMD, 20 mL at 06/03/16 0951 .  sodium chloride flush (NS) 0.9 % injection 10-40 mL, 10-40 mL, Intracatheter, PRN, PrGarvin FilaMD .  valproate (DEPACON) 750 mg in sodium chloride 0.9 % 100 mL IVPB, 750 mg, Intravenous, TID, PrGarvin FilaMD, 750 mg at 06/03/16 0935  Description: This is a routine EEG performed using standard  international 10-20 electrode placement. A total of 18 channels are recorded, including one for the EKG. this study is performed in the intensive care unit. The patient is intubated and sedated with propofol during the recording.  Activating Maneuvers: None  Findings:  The EKG channel demonstrates an irregularly irregular rhythm with a rate of about 100 beats per  minute.   This recording demonstrates a low voltage background that consists primarily of delta activity. Average background frequency is 1-3 hertz. Rare intermixed theta activity is present. There are periods of background suppression interspersed throughout this recording. This background shows no spontaneous reactivity. No reactivity is noted with passive eye opening or noxious stimulation.  There are no focal asymmetries. No epileptiform discharges are present. No seizures are recorded.   When compared to the previous EEG from 06/01/16, there has been a decrease in amplitudes. Frequencies are much slower with a greater amount of delta activity. The rhythmic spike and polyspike wave activity seen in the left occipital region is not present on today's study.  Impression:  This is an abnormal EEG due to moderate to severe diffuse generalized slowing consisting of low voltage delta activity with periods of suppression. No epileptiform abnormalities are noted. There is no evidence of seizure activity on this recording.  Clinical correlation: This pattern is consistent with a global encephalopathic picture, nonspecific as to etiology. This pattern can be seen with sedative medications, metabolic derangements, neurodegenerative disorders, and acute CNS insults including ischemia and infection. Clinical correlation is required. The abnormalities seen in the left occipital lobe on previous EEG are not present on today's study.   Melba Coon, MD [facility]

## 2016-06-03 NOTE — Progress Notes (Signed)
PULMONARY / CRITICAL CARE MEDICINE   Name: Ryan Anderson MRN: [id number] DOB: 1955/09/28    ADMISSION DATE:  2016-06-19 CONSULTATION DATE: 1/25  REFERRING MD: Neuro  CHIEF COMPLAINT: Stroke  BRIEF SUMMARY:  61 year old male admitted 1/22 with right-sided weakness & new onset atrial fibrillation.  The patient was found to have a new subacute PCA infarct involving the thalamus. He developed progressive lethargy and dysarthria (1/25) requiring intubation.   SUBJECTIVE:  RN reports no acute events overnight.  Pt remains on vent.  3% NS infusing.  Cardene gtt @ 9mg /hr, propofol for sedation.   VITAL SIGNS: BP (!) 157/77   Pulse (!) 117   Temp 99.9 F (37.7 C) (Axillary)   Resp (!) 21   Ht 5' 8.9" (1.75 m)   Wt 268 lb 11.2 oz (121.9 kg)   SpO2 (!) 88%   BMI 39.80 kg/m   HEMODYNAMICS:    VENTILATOR SETTINGS: Vent Mode: PRVC FiO2 (%):  [30 %-50 %] 50 % Set Rate:  [16 bmp] 16 bmp Vt Set:  [570 mL] 570 mL PEEP:  [5 cmH20] 5 cmH20 Plateau Pressure:  [11 cmH20-22 cmH20] 17 cmH20  INTAKE / OUTPUT: I/O last 3 completed shifts: In: 5736.1 [I.V.:4358.6; NG/GT:540; IV Piggyback:837.5] Out: 2475 [Urine:2475]  PHYSICAL EXAMINATION: General: Obese adult male in no acute distress on mechanical ventilation HEENT: MM pink/moist, ETT in place  Neuro: eyes closed, no response to verbal stimuli, on propofol CV: s1s2 rrr, no m/r/g PULM: non-labored, lungs bilaterally clear.   WU:JWJX, non-tender, bsx4 active, TF infusing  Extremities: warm/dry, edema  Skin: no rashes or lesions   LABS:  BMET  Recent Labs Lab June 19, 2016 2315 06-19-16 2323 06/02/16 0933  06/02/16 2329 06/03/16 0240 06/03/16 0500  NA 138 140 143  < > 146* 150* 149*  K 3.7 3.5 3.5  --   --   --  3.4*  CL 101 100* 110  --   --   --  114*  CO2 25  --  26  --   --   --  27  BUN 17 21* 39*  --   --   --  34*  CREATININE 1.26* 1.20 1.37*  --   --   --  1.06  GLUCOSE 235* 237* 130*  --   --   --  170*  < > =  values in this interval not displayed.  Electrolytes  Recent Labs Lab 19-Jun-2016 2315 06/02/16 0933 06/03/16 0500  CALCIUM 9.4 8.6* 8.5*    CBC  Recent Labs Lab 2016/06/19 2315 June 19, 2016 2323 06/02/16 0933 06/03/16 0500  WBC 11.4*  --  13.6* 15.7*  HGB 16.9 17.0 14.4 14.8  HCT 47.6 50.0 43.0 44.6  PLT 225  --  189 161    Coag's  Recent Labs Lab 06/19/2016 2315  APTT 30  INR 0.95    Sepsis Markers No results for input(s): LATICACIDVEN, PROCALCITON, O2SATVEN in the last 168 hours.  ABG  Recent Labs Lab 06/01/16 0115 06/01/16 1255  PHART 7.444 7.345*  PCO2ART 38.2 49.7*  PO2ART 94.7 319*    Liver Enzymes  Recent Labs Lab June 19, 2016 2315  AST 37  ALT 31  ALKPHOS 103  BILITOT 0.7  ALBUMIN 3.7    Cardiac Enzymes No results for input(s): TROPONINI, PROBNP in the last 168 hours.  Glucose  Recent Labs Lab 06/02/16 1122 06/02/16 1533 06/02/16 1954 06/02/16 2319 06/03/16 0350 06/03/16 0735  GLUCAP 144* 110* 136* 177* 159* 214*    Imaging Dg  Chest Port 1 View  Result Date: 06/03/2016 CLINICAL DATA:  Respiratory failure EXAM: PORTABLE CHEST 1 VIEW COMPARISON:  06/02/2016 FINDINGS: Endotracheal tube in good position. NG tube enters the stomach. Right sided PICC tip in the cavoatrial junction unchanged Mild bibasilar atelectasis unchanged.  No edema or effusion. IMPRESSION: Support lines remain in good position Bibasilar atelectasis unchanged. Electronically Signed   By: Marlan Palauharles  Clark M.D.   On: 06/03/2016 07:22   Dg Chest Port 1 View  Result Date: 06/02/2016 CLINICAL DATA:  Confirm central line placement EXAM: PORTABLE CHEST 1 VIEW COMPARISON:  06/01/2016 FINDINGS: Endotracheal tube with tip between the clavicular heads and carina. New right upper extremity PICC with tip at the upper cavoatrial junction. An orogastric tube has been placed and at least reaches the diaphragm. Low volume chest with vascular congestion and basilar atelectasis.  Cardiopericardial enlargement, accentuated by rotation and low volumes. IMPRESSION: 1. New PICC with tip at the upper cavoatrial junction. 2. Low volume chest with vascular congestion and basilar atelectasis. Electronically Signed   By: Marnee SpringJonathon  Watts M.D.   On: 06/02/2016 16:29     STUDIES:  CT head 1/25 > per radiologist. Acute left PCA territory infarction. More low-density and swelling in the region involved. Petechial blood products are present, without frank focal hematoma. Increased swelling results in mass effect with left-to-right shift of 5 mm. No ventricular trapping.  CULTURES: Tracheal aspirate 1/25 >>   ANTIBIOTICS: 1/25 zoysn >>  SIGNIFICANT EVENTS: 1/22 Admit with   LINES/TUBES: 1/25 ET>>   DISCUSSION: 61 year old male admitted 1/22 with new onset atrial fibrillation and right-sided weakness. Evaluation found him to be positive for an acute left PCA territory infarct with evidence of increasing cerebral edema on CT imaging. Patient received TPA. He had worsening lethargy and dysarthria requiring intubation. 3% hypertonic saline initiated. The patient remains in atrial fibrillation.   1. Acute PCA Infarct with Petechial Hemorrahge - s/p TPA, management per Neurology, 3% NS infusing.  ASA 325 mg QD.  Propofol for sedation.  2. Cerebral Edema - 3% NS as above.  Monitor serial neuro exams.  Goal Na level 150-155.  3. Acute Hypoxic Respiratory Failure - secondary to PCA infarct, continue PRVC 8cc/kg, wean PEEP/fiO2 for sats >92%, follow intermittent CXR.  Mental status does not support SBT. 4. Aspiration PNA / Pneumonitis - likely aspiration secondary to ICH/inability to protect airway.  Continue empiric zosyn as above.  Monitor CXR 5. Atrial Fibrillation - new onset, cardiology following.  Continue lopressor BID.  No anticoagulation given above CT findings.  6. Hypertension - continue cardene gtt, SBP parameters per Neurology 7. DM - SSI Q4  8. Leukocytosis - likely in  setting of PNA / reactive.  Monitor WBC trend. ABX as above.  9. Hypokalemia - replace K 10. Prophylaxis - SCD's for DVT, protonix QD for SUP  11. Diet - continue TF  12. Disop - ICU.  Poor prognosis.  DNR per family.   CC Time: 30 minutes   Canary BrimBrandi Dmoni Fortson, NP-C [facility] Pgr: [phone] or if no answer [phone] 06/03/2016, 8:43 AM

## 2016-06-03 NOTE — Progress Notes (Signed)
STROKE TEAM PROGRESS NOTE   SUBJECTIVE (INTERVAL HISTORY) No family at bedside. Pt was off sedation but developed agitation, tachycardia and has to be put back on propofol with bolus. BP still high overnight and was put on cardene drip. Still on 3% saline but also on NS so Na today 149. depakote level today 71.   OBJECTIVE Temp:  [98.4 F (36.9 C)-99.6 F (37.6 C)] 99.6 F (37.6 C) (01/27 0400) Pulse Rate:  [61-118] 117 (01/27 0700) Cardiac Rhythm: Atrial fibrillation (01/26 2000) Resp:  [0-26] 21 (01/27 0700) BP: (118-192)/(73-128) 157/77 (01/27 0700) SpO2:  [88 %-100 %] 88 % (01/27 0700) FiO2 (%):  [30 %-50 %] 50 % (01/27 0600) Weight:  [121.9 kg (268 lb 11.2 oz)] 121.9 kg (268 lb 11.2 oz) (01/27 0400)  CBC:   Recent Labs Lab 06/02/2016 2315  06/02/16 0933 06/03/16 0500  WBC 11.4*  --  13.6* 15.7*  NEUTROABS 8.1*  --   --   --   HGB 16.9  < > 14.4 14.8  HCT 47.6  < > 43.0 44.6  MCV 90.0  --  93.3 94.5  PLT 225  --  189 161  < > = values in this interval not displayed.  Basic Metabolic Panel:   Recent Labs Lab 06/02/16 0933  06/03/16 0240 06/03/16 0500  NA 143  < > 150* 149*  K 3.5  --   --  3.4*  CL 110  --   --  114*  CO2 26  --   --  27  GLUCOSE 130*  --   --  170*  BUN 39*  --   --  34*  CREATININE 1.37*  --   --  1.06  CALCIUM 8.6*  --   --  8.5*  < > = values in this interval not displayed.  Lipid Panel:     Component Value Date/Time   CHOL 159 05/30/2016 0340   TRIG 66 06/01/2016 1401   HDL 41 05/30/2016 0340   CHOLHDL 3.9 05/30/2016 0340   VLDL 12 05/30/2016 0340   LDLCALC 106 (H) 05/30/2016 0340   HgbA1c:  Lab Results  Component Value Date   HGBA1C 8.7 (H) 05/31/2016   Urine Drug Screen:     Component Value Date/Time   LABOPIA NONE DETECTED 06-02-2016 2336   COCAINSCRNUR NONE DETECTED 06/02/2016 2336   LABBENZ NONE DETECTED 2016/06/02 2336   AMPHETMU NONE DETECTED 02-Jun-2016 2336   THCU NONE DETECTED 2016/06/02 2336   LABBARB NONE  DETECTED 06-02-16 2336      IMAGING I have personally reviewed the radiological images below and agree with the radiology interpretations.  Portable chest x-ray 1 view  06/03/2016 Support lines remain in good position Bibasilar atelectasis unchanged.  Ct Angio Head and Neck W Or Wo Contrast 05/30/2016 1. Abrupt occlusion of the proximal left P2 segment.  2. No other large or proximal arterial branch occlusion identified.  3. Short-segment severe right A2 stenosis.  4. Multifocal atheromatous irregularity involving the distal MCA branches bilaterally.  5. Short-segment atheromatous stenosis of approximately 35% at the proximal left ICA.   Ct Head Wo Contrast 06/01/2016 Acute left PCA territory infarction. More low-density and swelling in the region involved.  Petechial blood products are present, without frank focal hematoma.  Increased swelling results in mass effect with left-to-right shift of 5 mm. No ventricular trapping.  Ct Head Wo Contrast 05/30/2016 1. Similar appearance of evolving left MCA territory infarcts involving the left basal ganglia and probable anterior  left frontal lobe.  2. Subtle loss of gray-white matter differentiation within the parasagittal left tempo-occipital region, suspicious for evolving left PCA territory infarct. This is compatible with previously identified left P2 occlusion.  3. No other new acute intracranial process identified.   Ct Head Code Stroke W/o Cm 06-18-2016 1. Acute evolving left basal ganglia infarct as above, with additional possible patchy involvement within the supra ganglionic anterior left frontal lobe.  2. ASPECTS is 6  3. Remote right basal ganglia infarct.  4. Mild chronic small vessel ischemic disease.   MRI Brain W/o Contrast - 1. Moderately motion degraded examination. Acute large LEFT PCA territory infarct including LEFT thalamus with petechial hemorrhage. 2. Subacute LEFT MCA territory infarct involving LEFT basal  ganglia and LEFT frontal lobe with petechial hemorrhage. 3. Old RIGHT basal ganglia infarct. 4. Mild chronic small vessel ischemic disease. Mild to moderate parenchymal brain volume loss.  EEG 06/01/16. Diffuse slowing with runs of rhythmic spikes and polyspike wave activity in the left occipital region lasting 30 seconds which may represent seizure activity  TTE - Left ventricle: The cavity size was normal. Wall thickness was   increased in a pattern of moderate LVH. Systolic function was   normal. The estimated ejection fraction was in the range of 55%   to 60%. Wall motion was normal; there were no regional wall   motion abnormalities. Doppler parameters are consistent with   restrictive physiology, indicative of decreased left ventricular   diastolic compliance and/or increased left atrial pressure. - Mitral valve: Calcified annulus. - Left atrium: The atrium was severely dilated. Impressions: - Technically difficult; definity used; normal LV systolic   function; moderate LVH; restrictive filling severe LAE.   PHYSICAL EXAM  Temp:  [98.4 F (36.9 C)-99.9 F (37.7 C)] 99.9 F (37.7 C) (01/27 0757) Pulse Rate:  [61-121] 87 (01/27 1000) Resp:  [0-26] 22 (01/27 1000) BP: (118-192)/(73-141) 160/91 (01/27 1000) SpO2:  [88 %-100 %] 94 % (01/27 1000) FiO2 (%):  [30 %-50 %] 40 % (01/27 0820) Weight:  [268 lb 11.2 oz (121.9 kg)] 268 lb 11.2 oz (121.9 kg) (01/27 0400)  General - obese, well developed, intubated on sedation.  Ophthalmologic - Fundi not visualized  Cardiovascular - irregularly irregular heart rate and rhythm with RVR.  Neuro - intubated on sedation, not responding to voice or pain, not open eyes, PERRL, left pupil 2mm, right pupil 2.75mm, no doll's eyes, weak corneal, positive gag. On pain stimulation, no movement of all extremities. DTR diminished and no babinski. Sensation, coordination, and gait not tested.     ASSESSMENT/PLAN Ryan Anderson is a 61 y.o. male  with history of HTN, new onset atrial fibrillation without anticoagulation, hyperglycemia, and obesity presenting with dysarthria with right sided weakness. He received IV t-PA Monday, 06-18-2016, at 2330.   Stroke:  Left PCA large infarct embolic secondary to atrial fibrillation not on AC.  Resultant  Intubated on sedation  MRI - motion degraded. Large left PCA infarct and subacute left MCA and basal ganglia and frontal lobe infarcts. Old right basal ganglia infarct.   CTA H&N - occlusion of the proximal left P2 segment.   2D Echo - EF 55% to 60%  LDL - 106  HgbA1c - 8.8   VTE prophylaxis - lovenox Diet NPO time specified  No antithrombotic prior to admission, now on ASA 325mg . No AC for now due to large left PCA infarct.  Ongoing aggressive stroke risk factor management  Therapy recommendations: pending  Disposition:  Pending  Cerebral edema  Mild mid line shift  On 3% saline, but also on NS, will d/c NS  Na 149 in am  Repeat CT head pending - may consider tape off 3% depending on CT findings  afib with RVR  New diagnosis  Not on AC PTA  No AC for now due to large infarct  Still has RVR, on metoprolol, will increase dose  Cardiology on aboard  Abnormal EEG  EEG showed runs of rhythmic spikes and polyspike wave activity in the left occipital region lasting 30 seconds  On depakote  depakote level 69->71  Repeat EEG pending  DM  A1C 8.8, goal < 7.0  SSI  Hyperglycemia on CBG monitoring  TF @ 15cc/h  Add lantus 10 units daily  Hypertension  High overnight - on Cardene drip  Increased metoprolol dose  Add lisinopril  Hyperlipidemia  Home meds:  No lipid lowering medications prior to admission.  LDL 106, goal < 70  Add lipitor 40mg   Continue statin at discharge  Other Stroke Risk Factors  Advanced age  ETOH use, advised to drink no more than 1 drink per day  Obesity, Body mass index is 39.8 kg/m., recommend weight loss, diet  and exercise as appropriate   Hx stroke/TIA - remote basal ganglia infarct by imaging  Other Active Problems  Leukocytosis - 15.7. Temp 99.9 Zosyn started Thurs 06/01/2016. Possible asp pneumonia.  Hypokalemia - 3.4. Supplemented and recheck in AM.   Hospital day # 4  This patient is critically ill due to left PCA large infarct, afib with RVR, hyperglycemia, intubated and at significant risk of neurological worsening, death form recurrent stroke, hemorrhagic transformation, heart failure, seizure. This patient's care requires constant monitoring of vital signs, hemodynamics, respiratory and cardiac monitoring, review of multiple databases, neurological assessment, discussion with family, other specialists and medical decision making of high complexity. I spent 50 minutes of neurocritical care time in the care of this patient.  Marvel PlanJindong Simmie Garin, MD PhD Stroke Neurology 06/03/2016 11:12 AM   To contact Stroke Continuity provider, please refer to [email or web address]. After hours, contact General Neurology

## 2016-06-03 NOTE — Progress Notes (Signed)
Sodium level drawn at 2230 was 164.  Redraw lab, sodium was 146 at 2329.

## 2016-06-03 NOTE — Progress Notes (Signed)
Bedside EEG completed, results pending. 

## 2016-06-04 ENCOUNTER — Inpatient Hospital Stay (HOSPITAL_COMMUNITY): Payer: Self-pay

## 2016-06-04 LAB — CBC
HCT: 44.5 % (ref 39.0–52.0)
HEMOGLOBIN: 14.1 g/dL (ref 13.0–17.0)
MCH: 31.1 pg (ref 26.0–34.0)
MCHC: 31.7 g/dL (ref 30.0–36.0)
MCV: 98.2 fL (ref 78.0–100.0)
PLATELETS: 169 10*3/uL (ref 150–400)
RBC: 4.53 MIL/uL (ref 4.22–5.81)
RDW: 13.5 % (ref 11.5–15.5)
WBC: 12.9 10*3/uL — AB (ref 4.0–10.5)

## 2016-06-04 LAB — GLUCOSE, CAPILLARY
GLUCOSE-CAPILLARY: 166 mg/dL — AB (ref 65–99)
GLUCOSE-CAPILLARY: 226 mg/dL — AB (ref 65–99)
GLUCOSE-CAPILLARY: 187 mg/dL — AB (ref 65–99)
GLUCOSE-CAPILLARY: 171 mg/dL — AB (ref 65–99)
Glucose-Capillary: 177 mg/dL — ABNORMAL HIGH (ref 65–99)
Glucose-Capillary: 187 mg/dL — ABNORMAL HIGH (ref 65–99)

## 2016-06-04 LAB — CULTURE, RESPIRATORY W GRAM STAIN: Culture: NORMAL

## 2016-06-04 LAB — URINALYSIS, COMPLETE (UACMP) WITH MICROSCOPIC
BILIRUBIN URINE: NEGATIVE
Bacteria, UA: NONE SEEN
GLUCOSE, UA: 50 mg/dL — AB
Ketones, ur: 5 mg/dL — AB
Leukocytes, UA: NEGATIVE
NITRITE: NEGATIVE
PROTEIN: 30 mg/dL — AB
SPECIFIC GRAVITY, URINE: 1.03 (ref 1.005–1.030)
Squamous Epithelial / LPF: NONE SEEN
pH: 5 (ref 5.0–8.0)

## 2016-06-04 LAB — BASIC METABOLIC PANEL
ANION GAP: 6 (ref 5–15)
BUN: 41 mg/dL — ABNORMAL HIGH (ref 6–20)
CALCIUM: 8.6 mg/dL — AB (ref 8.9–10.3)
CO2: 27 mmol/L (ref 22–32)
CREATININE: 1.3 mg/dL — AB (ref 0.61–1.24)
Chloride: 124 mmol/L — ABNORMAL HIGH (ref 101–111)
GFR calc non Af Amer: 57 mL/min — ABNORMAL LOW (ref >60)
Glucose, Bld: 163 mg/dL — ABNORMAL HIGH (ref 65–99)
Potassium: 3.1 mmol/L — ABNORMAL LOW (ref 3.5–5.1)
SODIUM: 157 mmol/L — AB (ref 135–145)

## 2016-06-04 LAB — AMMONIA: AMMONIA: 42 umol/L — AB (ref 9–35)

## 2016-06-04 LAB — SODIUM
SODIUM: 158 mmol/L — AB (ref 135–145)
SODIUM: 159 mmol/L — AB (ref 135–145)
SODIUM: 158 mmol/L — AB (ref 135–145)
SODIUM: 162 mmol/L — AB (ref 135–145)

## 2016-06-04 LAB — VALPROIC ACID LEVEL: Valproic Acid Lvl: 76 ug/mL (ref 50.0–100.0)

## 2016-06-04 LAB — TRIGLYCERIDES: TRIGLYCERIDES: 104 mg/dL (ref <150)

## 2016-06-04 MED ORDER — AMLODIPINE BESYLATE 10 MG PO TABS
10.0000 mg | ORAL_TABLET | Freq: Every day | ORAL | Status: DC
  Administered 2016-06-04 – 2016-06-05 (×2): 10 mg via ORAL
  Filled 2016-06-04 (×2): qty 1

## 2016-06-04 MED ORDER — LEVETIRACETAM 500 MG PO TABS
500.0000 mg | ORAL_TABLET | Freq: Two times a day (BID) | ORAL | Status: DC
  Administered 2016-06-04: 500 mg via ORAL
  Filled 2016-06-04 (×2): qty 1

## 2016-06-04 MED ORDER — LISINOPRIL 20 MG PO TABS
20.0000 mg | ORAL_TABLET | Freq: Two times a day (BID) | ORAL | Status: DC
  Administered 2016-06-04 – 2016-06-07 (×7): 20 mg via ORAL
  Filled 2016-06-04 (×7): qty 1

## 2016-06-04 MED ORDER — DILTIAZEM LOAD VIA INFUSION
15.0000 mg | Freq: Once | INTRAVENOUS | Status: AC
  Administered 2016-06-05: 15 mg via INTRAVENOUS
  Filled 2016-06-04: qty 15

## 2016-06-04 MED ORDER — DILTIAZEM HCL 100 MG IV SOLR
5.0000 mg/h | INTRAVENOUS | Status: DC
  Filled 2016-06-04: qty 100

## 2016-06-04 MED ORDER — FREE WATER
250.0000 mL | Status: DC
  Administered 2016-06-04 – 2016-06-05 (×2): 250 mL

## 2016-06-04 MED ORDER — LEVETIRACETAM 500 MG PO TABS
1000.0000 mg | ORAL_TABLET | Freq: Once | ORAL | Status: AC
  Administered 2016-06-04: 1000 mg via ORAL
  Filled 2016-06-04: qty 2

## 2016-06-04 MED ORDER — DILTIAZEM LOAD VIA INFUSION
15.0000 mg | Freq: Once | INTRAVENOUS | Status: DC
  Filled 2016-06-04: qty 15

## 2016-06-04 MED ORDER — SODIUM CHLORIDE 0.9 % IV SOLN
2500.0000 mg | Freq: Once | INTRAVENOUS | Status: AC
  Administered 2016-06-04: 2500 mg via INTRAVENOUS
  Filled 2016-06-04: qty 2500

## 2016-06-04 MED ORDER — DILTIAZEM HCL-DEXTROSE 100-5 MG/100ML-% IV SOLN (PREMIX)
5.0000 mg/h | INTRAVENOUS | Status: DC
  Administered 2016-06-05: 12.5 mg/h via INTRAVENOUS
  Administered 2016-06-05: 5 mg/h via INTRAVENOUS
  Filled 2016-06-04 (×2): qty 100

## 2016-06-04 MED ORDER — SODIUM CHLORIDE 0.45 % IV SOLN
INTRAVENOUS | Status: DC
  Administered 2016-06-04: 12:00:00 via INTRAVENOUS

## 2016-06-04 MED ORDER — VANCOMYCIN HCL IN DEXTROSE 1-5 GM/200ML-% IV SOLN
1000.0000 mg | Freq: Two times a day (BID) | INTRAVENOUS | Status: DC
  Administered 2016-06-05: 1000 mg via INTRAVENOUS
  Filled 2016-06-04 (×2): qty 200

## 2016-06-04 MED ORDER — CHLORHEXIDINE GLUCONATE 0.12 % MT SOLN
OROMUCOSAL | Status: AC
  Filled 2016-06-04: qty 15

## 2016-06-04 MED ORDER — INSULIN GLARGINE 100 UNIT/ML ~~LOC~~ SOLN
20.0000 [IU] | Freq: Every day | SUBCUTANEOUS | Status: DC
  Administered 2016-06-05: 20 [IU] via SUBCUTANEOUS
  Filled 2016-06-04: qty 0.2

## 2016-06-04 MED ORDER — FUROSEMIDE 10 MG/ML IJ SOLN
40.0000 mg | Freq: Once | INTRAMUSCULAR | Status: AC
  Administered 2016-06-04: 40 mg via INTRAVENOUS
  Filled 2016-06-04: qty 4

## 2016-06-04 MED ORDER — POTASSIUM CHLORIDE CRYS ER 20 MEQ PO TBCR
40.0000 meq | EXTENDED_RELEASE_TABLET | ORAL | Status: AC
  Administered 2016-06-04 (×2): 40 meq via ORAL
  Filled 2016-06-04 (×2): qty 2

## 2016-06-04 NOTE — Progress Notes (Signed)
PULMONARY / CRITICAL CARE MEDICINE   Name: Ryan Anderson MRN: [id number] DOB: Sep 01, 1955    ADMISSION DATE:  06-02-16 CONSULTATION DATE: 1/25  REFERRING MD: Neuro  CHIEF COMPLAINT: Stroke  BRIEF SUMMARY:  61 year old male admitted 1/22 with right-sided weakness & new onset atrial fibrillation.  The patient was found to have a new subacute PCA infarct involving the thalamus. He developed progressive lethargy and dysarthria (1/25) requiring intubation.   SUBJECTIVE:   RN reports periods of vent dyssynchrony when off propofol with associated desaturation.  Pt's wife is deceased.  Son has Asperger's and daughter next of kin.  Tmax 102  Concern for possible LUE swelling (has had BP cuff on this arm, no invasive access on this side).   VITAL SIGNS: BP (!) 154/61   Pulse 99   Temp (!) 100.4 F (38 C) (Axillary)   Resp (!) 26   Ht 5' 8.9" (1.75 m)   Wt 270 lb 15.1 oz (122.9 kg)   SpO2 93%   BMI 40.13 kg/m   HEMODYNAMICS:    VENTILATOR SETTINGS: Vent Mode: PRVC FiO2 (%):  [40 %-50 %] 50 % Set Rate:  [16 bmp] 16 bmp Vt Set:  [570 mL] 570 mL PEEP:  [5 cmH20] 5 cmH20 Pressure Support:  [5 cmH20] 5 cmH20 Plateau Pressure:  [13 cmH20-16 cmH20] 13 cmH20  INTAKE / OUTPUT: I/O last 3 completed shifts: In: 5766 [I.V.:4546; NG/GT:590; IV Piggyback:630] Out: 2895 [Urine:2895]  PHYSICAL EXAMINATION: General: obese male in NAD on vent  HEENT: MM pink/moist, ETT in place  Neuro: sedate on propofol  CV: s1s2 rrr, no m/r/g PULM: non-labored, lungs bilaterally coarse  WU:JWJX, non-tender, bsx4 active  Extremities: warm/dry, generalized edema 2+ Skin: no rashes or lesions   LABS:  BMET  Recent Labs Lab 06/02/16 0933  06/03/16 0500  06/04/16 0200 06/04/16 0545 06/04/16 0918  NA 143  < > 149*  < > 158* 157* 159*  K 3.5  --  3.4*  --   --  3.1*  --   CL 110  --  114*  --   --  124*  --   CO2 26  --  27  --   --  27  --   BUN 39*  --  34*  --   --  41*  --   CREATININE  1.37*  --  1.06  --   --  1.30*  --   GLUCOSE 130*  --  170*  --   --  163*  --   < > = values in this interval not displayed.  Electrolytes  Recent Labs Lab 06/02/16 0933 06/03/16 0500 06/04/16 0545  CALCIUM 8.6* 8.5* 8.6*    CBC  Recent Labs Lab 06/02/16 0933 06/03/16 0500 06/04/16 0545  WBC 13.6* 15.7* 12.9*  HGB 14.4 14.8 14.1  HCT 43.0 44.6 44.5  PLT 189 161 169    Coag's  Recent Labs Lab 06-02-16 2315  APTT 30  INR 0.95    Sepsis Markers No results for input(s): LATICACIDVEN, PROCALCITON, O2SATVEN in the last 168 hours.  ABG  Recent Labs Lab 06/01/16 0115 06/01/16 1255  PHART 7.444 7.345*  PCO2ART 38.2 49.7*  PO2ART 94.7 319*    Liver Enzymes  Recent Labs Lab 02-Jun-2016 2315  AST 37  ALT 31  ALKPHOS 103  BILITOT 0.7  ALBUMIN 3.7    Cardiac Enzymes No results for input(s): TROPONINI, PROBNP in the last 168 hours.  Glucose  Recent Labs Lab 06/03/16 1135  06/03/16 1644 06/03/16 1924 06/03/16 2314 06/04/16 0329 06/04/16 0733  GLUCAP 189* 184* 179* 219* 166* 177*    Imaging Ct Head Wo Contrast  Result Date: 06/03/2016 CLINICAL DATA:  Acute ischemic stroke. EXAM: CT HEAD WITHOUT CONTRAST TECHNIQUE: Contiguous axial images were obtained from the base of the skull through the vertex without intravenous contrast. COMPARISON:  06/01/2016 FINDINGS: Brain: Acute infarct throughout the left PCA territory with hemorrhagic conversion patchy along the cortex and in the thalamus. Hemorrhage is mildly increased, without focal hematoma. Midline shift measures 6 mm at the septum pellucidum, 1 mm greater than before. No uncal or subfalcine herniation. Remote perforator infarcts in the bilateral lenticulostriate distribution. No hydrocephalus/entrapment. Vascular: No hyperdense vessel noted. Skull: No acute finding Sinuses/Orbits: Negative These results were called by telephone at the time of interpretation on 06/03/2016 at 4:33 pm to Dr. Marvel Plan , who  verbally acknowledged these results. IMPRESSION: Acute left PCA territory infarct with increased swelling. Midline shift measures 6 mm; no herniation or ventricular entrapment. Hemorrhagic conversion is minimally increased, no focal hematoma. Electronically Signed   By: Marnee Spring M.D.   On: 06/03/2016 16:33   Dg Chest Port 1 View  Result Date: 06/04/2016 CLINICAL DATA:  Acute respiratory failure with hypoxia EXAM: PORTABLE CHEST 1 VIEW COMPARISON:  06/03/2016 FINDINGS: Endotracheal tube, right PICC line and NG tube are unchanged. Cardiomegaly with bilateral perihilar and lower lobe opacities, increasing on the right since prior study. Small effusions. IMPRESSION: Bilateral perihilar and lower lobe opacities, likely edema/ CHF. This is worsened in the right base since prior study. Small layering effusions. Electronically Signed   By: Charlett Nose M.D.   On: 06/04/2016 07:51     STUDIES:  CT head 1/25 > per radiologist. Acute left PCA territory infarction. More low-density and swelling in the region involved. Petechial blood products are present, without frank focal hematoma. Increased swelling results in mass effect with left-to-right shift of 5 mm. No ventricular trapping. BLE Venous Duplex 1/28 >>  LUE Venous Duplex 1/28 >>   CULTURES: Tracheal aspirate 1/25 >>   ANTIBIOTICS: 1/25 zoysn >> 1/28 vanco >>   SIGNIFICANT EVENTS: 1/22 Admit with new AF, R sided weakness  LINES/TUBES: ETT 1/25 >> RUE PICC 1/26 >>    DISCUSSION: 61 year old male admitted 1/22 with new onset atrial fibrillation and right-sided weakness. Evaluation found him to be positive for an acute left PCA territory infarct with evidence of increasing cerebral edema on CT imaging. Patient received TPA. He had worsening lethargy and dysarthria requiring intubation. 3% hypertonic saline initiated. The patient remains in atrial fibrillation.   1. Acute PCA Infarct with Petechial Hemorrhage - s/p TPA, mgmt per  Neurology, 3%NS on hold this am due to Na >155.  Continue protocol.  ASA 325 mg QD.  Propofol for sedation  2. Cerebral Edema - 3% NS as above.  Monitor serial neuro exams. Goal Na 150-155  3. Acute Hypoxic Respiratory Failure - secondary to PCA infarct, continue PRVC, follow intermittent CXR 4. Aspiration PNA / Pneumonitis - likely aspiration in the setting of ICH / inability to protect airway.  Continue zosyn as above.  Monitor CXR 5. Fever - monitor, await cultures.  Add vanco 1/28.  Assess BLE & LUE doppler.  6. Pleural Effusions - consider diuresis, will discuss with attending MD given 3% protocol 7. Atrial Fibrillation - new onset, cardiology following, continue lopressor BID.  No anticoatulation given CT head findings  8. AKI - monitor sr cr, limit nephrotoxic agents  as able  9. HTN - SBP parameters per Neurology  10. DM - SSI  11. Leukocytosis - likely reactive + aspiration PNA  12. Hypokalemia - replace K as appropriate  13. Prophylaxis - SCD's, PPI for SUP  14. Diet - continue TF 15. Dispo - ICU, poor prognosis.  DNR per family.    CC Time: 30 minutes   Canary BrimBrandi Tyreik Delahoussaye, NP-C [facility] Pgr: [phone] or if no answer [phone] 06/04/2016, 10:24 AM

## 2016-06-04 NOTE — Progress Notes (Signed)
RT note- fio2 and peep increased due to low spo2 88-90%

## 2016-06-04 NOTE — Progress Notes (Signed)
eLink Physician-Brief Progress Note Patient Name: Ryan Anderson DOB: 1956-04-02 MRN: [id number]   Date of Service  06/04/2016  HPI/Events of Note  Nursing requesting flexiseal  eICU Interventions  ordered     Intervention Category Minor Interventions: Routine modifications to care plan (e.g. PRN medications for pain, fever)  Ryan Anderson 06/04/2016, 9:01 PM

## 2016-06-04 NOTE — Progress Notes (Signed)
STROKE TEAM PROGRESS NOTE   SUBJECTIVE (INTERVAL HISTORY) Pt RN at bedside. No family present. Pt still intubated but off sedation one hour prior to rounding, still not responsive to pain. Pt had fever overnight, still in afib with RVR. Will call cardiology back. BP still on the high side, will increase BP meds and taper off cardene as able.   OBJECTIVE Temp:  [99.9 F (37.7 C)-101.5 F (38.6 C)] 100.4 F (38 C) (01/28 0736) Pulse Rate:  [35-141] 120 (01/28 0845) Cardiac Rhythm: Atrial fibrillation (01/28 0830) Resp:  [0-28] 24 (01/28 0845) BP: (118-190)/(61-101) 122/64 (01/28 0845) SpO2:  [90 %-100 %] 95 % (01/28 0845) FiO2 (%):  [40 %-50 %] 50 % (01/28 0800) Weight:  [122.9 kg (270 lb 15.1 oz)] 122.9 kg (270 lb 15.1 oz) (01/28 0354)  CBC:   Recent Labs Lab December 20, 2016 2315  06/03/16 0500 06/04/16 0545  WBC 11.4*  < > 15.7* 12.9*  NEUTROABS 8.1*  --   --   --   HGB 16.9  < > 14.8 14.1  HCT 47.6  < > 44.6 44.5  MCV 90.0  < > 94.5 98.2  PLT 225  < > 161 169  < > = values in this interval not displayed.  Basic Metabolic Panel:   Recent Labs Lab 06/03/16 0500  06/04/16 0200 06/04/16 0545  NA 149*  < > 158* 157*  K 3.4*  --   --  3.1*  CL 114*  --   --  124*  CO2 27  --   --  27  GLUCOSE 170*  --   --  163*  BUN 34*  --   --  41*  CREATININE 1.06  --   --  1.30*  CALCIUM 8.5*  --   --  8.6*  < > = values in this interval not displayed.  Lipid Panel:     Component Value Date/Time   CHOL 159 05/30/2016 0340   TRIG 104 06/04/2016 0545   HDL 41 05/30/2016 0340   CHOLHDL 3.9 05/30/2016 0340   VLDL 12 05/30/2016 0340   LDLCALC 106 (H) 05/30/2016 0340   HgbA1c:  Lab Results  Component Value Date   HGBA1C 8.7 (H) 05/31/2016   Urine Drug Screen:     Component Value Date/Time   LABOPIA NONE DETECTED 10/10/2016 2336   COCAINSCRNUR NONE DETECTED 10/10/2016 2336   LABBENZ NONE DETECTED 10/10/2016 2336   AMPHETMU NONE DETECTED 10/10/2016 2336   THCU NONE DETECTED  10/10/2016 2336   LABBARB NONE DETECTED 10/10/2016 2336      IMAGING I have personally reviewed the radiological images below and agree with the radiology interpretations.  Portable chest x-ray 1 view  06/04/2016 Bilateral perihilar and lower lobe opacities, likely edema/ CHF. This is worsened in the right base since prior study.  Ct Head Wo Contrast 06/03/2016 Acute left PCA territory infarct with increased swelling.  Midline shift measures 6 mm; no herniation or ventricular entrapment. Hemorrhagic conversion is minimally increased, no focal hematoma.  06/01/2016 Acute left PCA territory infarction. More low-density and swelling in the region involved.  Petechial blood products are present, without frank focal hematoma.  Increased swelling results in mass effect with left-to-right shift of 5 mm. No ventricular trapping.  05/30/2016 1. Similar appearance of evolving left MCA territory infarcts involving the left basal ganglia and probable anterior left frontal lobe.  2. Subtle loss of gray-white matter differentiation within the parasagittal left tempo-occipital region, suspicious for evolving left PCA territory infarct.  This is compatible with previously identified left P2 occlusion.  3. No other new acute intracranial process identified.   06/27/2016 1. Acute evolving left basal ganglia infarct as above, with additional possible patchy involvement within the supra ganglionic anterior left frontal lobe.  2. ASPECTS is 6  3. Remote right basal ganglia infarct.  4. Mild chronic small vessel ischemic disease.   Ct Angio Head and Neck W Or Wo Contrast 05/30/2016 1. Abrupt occlusion of the proximal left P2 segment.  2. No other large or proximal arterial branch occlusion identified.  3. Short-segment severe right A2 stenosis.  4. Multifocal atheromatous irregularity involving the distal MCA branches bilaterally.  5. Short-segment atheromatous stenosis of approximately 35% at the  proximal left ICA.   MRI Brain W/o Contrast  05/30/2016 1. Moderately motion degraded examination. Acute large LEFT PCA territory infarct including LEFT thalamus with petechial hemorrhage. 2. Subacute LEFT MCA territory infarct involving LEFT basal ganglia and LEFT frontal lobe with petechial hemorrhage. 3. Old RIGHT basal ganglia infarct. 4. Mild chronic small vessel ischemic disease. Mild to moderate parenchymal brain volume loss.  EEG  06/01/16. Diffuse slowing with runs of rhythmic spikes and polyspike wave activity in the left occipital region lasting 30 seconds which may represent seizure activity  EEG 06/03/2016 Impression:  This is an abnormal EEG due to moderate to severe diffuse generalized slowing consisting of low voltage delta activity with periods of suppression. No epileptiform abnormalities are noted. There is no evidence of seizure activity on this recording. Clinical correlation: This pattern is consistent with a global encephalopathic picture, nonspecific as to etiology. This pattern can be seen with sedative medications, metabolic derangements, neurodegenerative disorders, and acute CNS insults including ischemia and infection. Clinical correlation is required.  The abnormalities seen in the left occipital lobe on previous EEG are not present on today's study.   TTE - Left ventricle: The cavity size was normal. Wall thickness was   increased in a pattern of moderate LVH. Systolic function was   normal. The estimated ejection fraction was in the range of 55%   to 60%. Wall motion was normal; there were no regional wall   motion abnormalities. Doppler parameters are consistent with   restrictive physiology, indicative of decreased left ventricular   diastolic compliance and/or increased left atrial pressure. - Mitral valve: Calcified annulus. - Left atrium: The atrium was severely dilated. Impressions: - Technically difficult; definity used; normal LV systolic    function; moderate LVH; restrictive filling severe LAE.   PHYSICAL EXAM  Temp:  [99.9 F (37.7 C)-101.5 F (38.6 C)] 100.4 F (38 C) (01/28 0736) Pulse Rate:  [35-141] 120 (01/28 0845) Resp:  [0-28] 24 (01/28 0845) BP: (118-190)/(61-101) 122/64 (01/28 0845) SpO2:  [90 %-100 %] 95 % (01/28 0845) FiO2 (%):  [40 %-50 %] 50 % (01/28 0800) Weight:  [122.9 kg (270 lb 15.1 oz)] 122.9 kg (270 lb 15.1 oz) (01/28 0354)  General - obese, well developed, intubated off sedation.  Ophthalmologic - Fundi not visualized  Cardiovascular - irregularly irregular heart rate and rhythm with RVR.  Neuro - intubated off sedation, not responding to voice or pain, not open eyes, PERRL, left pupil 2mm, right pupil 2.56mm, reactive to light, weak and slow doll's eyes, weak corneal R>L, positive gag. On pain stimulation, no movement of all extremities. DTR diminished and no babinski. Sensation, coordination, and gait not tested.     ASSESSMENT/PLAN [name] is a 61 y.o. male with history of HTN, new  onset atrial fibrillation without anticoagulation, hyperglycemia, and obesity presenting with dysarthria with right sided weakness.  He received IV t-PA Monday, 21-Jun-2016, at 2330.   Stroke:  Left PCA large infarct embolic secondary to atrial fibrillation not on AC.  Resultant  Intubated off sedation in coma  MRI - motion degraded. Large left PCA infarct and subacute left MCA and basal ganglia and frontal lobe infarcts. Old right basal ganglia infarct.   CTA H&N - occlusion of the proximal left P2 segment.   2D Echo - EF 55% to 60%  LDL - 106  HgbA1c - 8.8   VTE prophylaxis - lovenox Diet NPO time specified  No antithrombotic prior to admission, now on ASA 325mg . No AC for now due to large left PCA infarct.  Ongoing aggressive stroke risk factor management  Therapy recommendations: pending  Disposition:  Pending  Cerebral edema  Mild mid line shift  Was on but now off 3%  saline  Na 157->159  Put on 1/2 NS  Repeat CT head - Acute left PCA territory infarct with increased swelling. Midline shift measures 6 mm.  AMS with coma  clinical picture worse than expected from MRI findings  EEG showed runs of rhythmic spikes and polyspike wave activity in the left occipital region lasting 30 seconds  On depakote  depakote level 69->71->76  Repeat EEG - diffuse slow without seizure activity  Ammonia 42 -> daily monitoring  Left finger rhythmic tapping, put on keppra  afib with RVR  New diagnosis  Not on AC PTA  No AC for now due to large infarct  Still has RVR, on metoprolol 50mg  bid and PRN IV metoprolol  CXR concerning for CHF  Cardiology on aboard - Cardiology called to see pt today for Afib with RVR and CHF.  Fever  Tmax 101.4  Leukocytosis - 15.7 -> 12.9   CXR - CHF and suspected asp pneumonia.  UA neg x 2  Blood Cx pending  Sputum Cx pending  On zosyn started 06/01/2016   CCM on board  DM  A1C 8.8, goal < 7.0  SSI  Hyperglycemia on CBG monitoring  TF @ 15cc/h  on lantus 10 units daily  Hypertension  High overnight - off Cardene now  Increased metoprolol dose to 50 bid  Add lisinopril 20 bid  Hyperlipidemia  Home meds:  No lipid lowering medications prior to admission.  LDL 106, goal < 70  on lipitor 40mg   Continue statin at discharge  Other Stroke Risk Factors  Advanced age  ETOH use, advised to drink no more than 1 drink per day  Obesity, Body mass index is 40.13 kg/m., recommend weight loss, diet and exercise as appropriate   Hx stroke/TIA - remote basal ganglia infarct by imaging  Other Active Problems  Hypokalemia - 3.4 -> supplemented -> 3.1 -> additional supplement ordered.  AKI - 41 / 1.30, repeat labs Monday AM.   Hospital day # 5  This patient is critically ill due to left PCA large infarct, afib with RVR, hyperglycemia, intubated and at significant risk of neurological  worsening, death form recurrent stroke, hemorrhagic transformation, heart failure, seizure. This patient's care requires constant monitoring of vital signs, hemodynamics, respiratory and cardiac monitoring, review of multiple databases, neurological assessment, discussion with family, other specialists and medical decision making of high complexity. I spent 50 minutes of neurocritical care time in the care of this patient.  Marvel Plan, MD PhD Stroke Neurology 06/04/2016 11:45 AM   To contact Stroke Continuity  provider, please refer to [email or web address] After hours, contact General Neurology

## 2016-06-04 NOTE — Progress Notes (Signed)
Pharmacy Antibiotic Note  Ryan Anderson is a 61 y.o. male admitted on 05/10/2016 with pneumonia.  Patient has been on zosyn since 1/25, but now with increased fevers  Plan: Continue zosyn per MD Adding vanc d/t fevers Vancomycin 2500 mg x 1 then 1g q12h Monitor renal fx, cx, vt prn  Height: 5' 8.9" (175 cm) Weight: 270 lb 15.1 oz (122.9 kg) IBW/kg (Calculated) : 70.47  Temp (24hrs), Avg:100.9 F (38.3 C), Min:99.9 F (37.7 C), Max:102 F (38.9 C)   Recent Labs Lab 05/10/2016 2315 05/26/2016 2323 06/02/16 0933 06/03/16 0500 06/04/16 0545  WBC 11.4*  --  13.6* 15.7* 12.9*  CREATININE 1.26* 1.20 1.37* 1.06 1.30*    Estimated Creatinine Clearance: 76.3 mL/min (by C-G formula based on SCr of 1.3 mg/dL (H)).    No Known Allergies  Isaac BlissMichael Noely Kuhnle, PharmD, BCPS, BCCCP Clinical Pharmacist Clinical phone for 06/04/2016 from 7a-3:30p: [phone] If after 3:30p, please call main pharmacy at: [phone] 06/04/2016 1:03 PM

## 2016-06-04 NOTE — Progress Notes (Signed)
Patient converted back into normal sinus rhythm at this time, 2034. eLink doctor notified and will continue to monitor

## 2016-06-05 ENCOUNTER — Inpatient Hospital Stay (HOSPITAL_COMMUNITY): Payer: Self-pay

## 2016-06-05 DIAGNOSIS — I6300 Cerebral infarction due to thrombosis of unspecified precerebral artery: Secondary | ICD-10-CM

## 2016-06-05 DIAGNOSIS — M7989 Other specified soft tissue disorders: Secondary | ICD-10-CM

## 2016-06-05 DIAGNOSIS — R509 Fever, unspecified: Secondary | ICD-10-CM

## 2016-06-05 DIAGNOSIS — R9401 Abnormal electroencephalogram [EEG]: Secondary | ICD-10-CM

## 2016-06-05 LAB — CBC
HEMATOCRIT: 44.6 % (ref 39.0–52.0)
Hemoglobin: 13.8 g/dL (ref 13.0–17.0)
MCH: 31.2 pg (ref 26.0–34.0)
MCHC: 30.9 g/dL (ref 30.0–36.0)
MCV: 100.9 fL — AB (ref 78.0–100.0)
Platelets: 171 10*3/uL (ref 150–400)
RBC: 4.42 MIL/uL (ref 4.22–5.81)
RDW: 13.9 % (ref 11.5–15.5)
WBC: 14.5 10*3/uL — ABNORMAL HIGH (ref 4.0–10.5)

## 2016-06-05 LAB — GLUCOSE, CAPILLARY
GLUCOSE-CAPILLARY: 186 mg/dL — AB (ref 65–99)
GLUCOSE-CAPILLARY: 160 mg/dL — AB (ref 65–99)
Glucose-Capillary: 231 mg/dL — ABNORMAL HIGH (ref 65–99)
Glucose-Capillary: 265 mg/dL — ABNORMAL HIGH (ref 65–99)
Glucose-Capillary: 209 mg/dL — ABNORMAL HIGH (ref 65–99)

## 2016-06-05 LAB — SODIUM
SODIUM: 160 mmol/L — AB (ref 135–145)
SODIUM: 159 mmol/L — AB (ref 135–145)
Sodium: 161 mmol/L (ref 135–145)
Sodium: 162 mmol/L (ref 135–145)

## 2016-06-05 LAB — BASIC METABOLIC PANEL
Anion gap: 9 (ref 5–15)
BUN: 46 mg/dL — ABNORMAL HIGH (ref 6–20)
CALCIUM: 8.5 mg/dL — AB (ref 8.9–10.3)
CHLORIDE: 125 mmol/L — AB (ref 101–111)
CO2: 28 mmol/L (ref 22–32)
Creatinine, Ser: 1.37 mg/dL — ABNORMAL HIGH (ref 0.61–1.24)
GFR calc Af Amer: >60 mL/min (ref >60)
GFR calc non Af Amer: 54 mL/min — ABNORMAL LOW (ref >60)
GLUCOSE: 175 mg/dL — AB (ref 65–99)
Potassium: 3.3 mmol/L — ABNORMAL LOW (ref 3.5–5.1)
Sodium: 162 mmol/L (ref 135–145)

## 2016-06-05 LAB — AMMONIA: AMMONIA: 55 umol/L — AB (ref 9–35)

## 2016-06-05 LAB — VALPROIC ACID LEVEL: Valproic Acid Lvl: 63 ug/mL (ref 50.0–100.0)

## 2016-06-05 MED ORDER — DILTIAZEM 12 MG/ML ORAL SUSPENSION
60.0000 mg | Freq: Four times a day (QID) | ORAL | Status: DC
  Administered 2016-06-05 – 2016-06-07 (×9): 60 mg via ORAL
  Filled 2016-06-05 (×10): qty 6

## 2016-06-05 MED ORDER — FREE WATER
300.0000 mL | Status: DC
  Administered 2016-06-05 – 2016-06-07 (×18): 300 mL

## 2016-06-05 MED ORDER — LEVALBUTEROL HCL 0.63 MG/3ML IN NEBU: 0.6300 mg | INHALATION_SOLUTION | Freq: Four times a day (QID) | RESPIRATORY_TRACT | Status: DC | PRN

## 2016-06-05 MED ORDER — DEXTROSE 5 % IV SOLN
1.0000 g | INTRAVENOUS | Status: AC
  Administered 2016-06-05 – 2016-06-07 (×3): 1 g via INTRAVENOUS
  Filled 2016-06-05 (×3): qty 10

## 2016-06-05 MED ORDER — INSULIN GLARGINE 100 UNIT/ML ~~LOC~~ SOLN
30.0000 [IU] | Freq: Every day | SUBCUTANEOUS | Status: DC
  Administered 2016-06-06 – 2016-06-07 (×2): 30 [IU] via SUBCUTANEOUS
  Filled 2016-06-05 (×2): qty 0.3

## 2016-06-05 MED ORDER — POTASSIUM CHLORIDE 20 MEQ/15ML (10%) PO SOLN
40.0000 meq | ORAL | Status: AC
  Administered 2016-06-05 (×2): 40 meq
  Filled 2016-06-05 (×2): qty 30

## 2016-06-05 MED ORDER — LEVETIRACETAM 100 MG/ML PO SOLN
1000.0000 mg | Freq: Two times a day (BID) | ORAL | Status: DC
  Administered 2016-06-05 – 2016-06-07 (×4): 1000 mg
  Filled 2016-06-05 (×4): qty 10

## 2016-06-05 MED ORDER — LEVETIRACETAM 100 MG/ML PO SOLN
500.0000 mg | Freq: Two times a day (BID) | ORAL | Status: DC
  Administered 2016-06-05: 500 mg
  Filled 2016-06-05: qty 5

## 2016-06-05 NOTE — Progress Notes (Signed)
*  PRELIMINARY RESULTS* Vascular Ultrasound Bilateral lower extremity venous duplex has been completed.  Preliminary findings: right lower extremity is negative for deep vein thrombosis. A small segment of non occlusive, ?mobile thrombosis seen near origin of left greater saphenous vein in the common femoral vein.  Other visualized veins of the left lower extremity appear negative for DVT. Negative for bakers cysts bilaterally.  Preliminary results given to nurse at bedside 14:45.  Chauncey FischerCharlotte C Jeweliana Dudgeon 06/05/2016, 2:44 PM

## 2016-06-05 NOTE — Progress Notes (Signed)
Placed on full support at this time

## 2016-06-05 NOTE — Progress Notes (Signed)
STROKE TEAM PROGRESS NOTE   SUBJECTIVE (INTERVAL HISTORY) Pt sister is at bedside. I had long discussion with family (sister and son) at bedside yesterday. They are discussing with pt daughter regarding further plan including trach, PEG, palliative care, etc. Pt still has fever overnight, afib RVR and put on cardizem drip. On vanco and zosyn, still not responsive, on propofol, but off cardene.    OBJECTIVE Temp:  [98.3 F (36.8 C)-101.8 F (38.8 C)] 98.3 F (36.8 C) (01/29 0800) Pulse Rate:  [35-148] 56 (01/29 1200) Cardiac Rhythm: Atrial fibrillation (01/29 0400) Resp:  [8-31] 20 (01/29 1200) BP: (103-192)/(58-119) 127/87 (01/29 1200) SpO2:  [89 %-100 %] 96 % (01/29 1200) FiO2 (%):  [40 %-60 %] 40 % (01/29 0745)  CBC:   Recent Labs Lab 05/22/2016 2315  06/04/16 0545 06/05/16 0442  WBC 11.4*  < > 12.9* 14.5*  NEUTROABS 8.1*  --   --   --   HGB 16.9  < > 14.1 13.8  HCT 47.6  < > 44.5 44.6  MCV 90.0  < > 98.2 100.9*  PLT 225  < > 169 171  < > = values in this interval not displayed.  Basic Metabolic Panel:   Recent Labs Lab 06/04/16 0545  06/05/16 0442 06/05/16 0835  NA 157*  < > 162* 161*  K 3.1*  --  3.3*  --   CL 124*  --  125*  --   CO2 27  --  28  --   GLUCOSE 163*  --  175*  --   BUN 41*  --  46*  --   CREATININE 1.30*  --  1.37*  --   CALCIUM 8.6*  --  8.5*  --   < > = values in this interval not displayed.  Lipid Panel:     Component Value Date/Time   CHOL 159 05/30/2016 0340   TRIG 104 06/04/2016 0545   HDL 41 05/30/2016 0340   CHOLHDL 3.9 05/30/2016 0340   VLDL 12 05/30/2016 0340   LDLCALC 106 (H) 05/30/2016 0340   HgbA1c:  Lab Results  Component Value Date   HGBA1C 8.7 (H) 05/31/2016   Urine Drug Screen:     Component Value Date/Time   LABOPIA NONE DETECTED 05/23/2016 2336   COCAINSCRNUR NONE DETECTED 05/24/2016 2336   LABBENZ NONE DETECTED 06/03/2016 2336   AMPHETMU NONE DETECTED 05/25/2016 2336   THCU NONE DETECTED 05/24/2016 2336   LABBARB NONE DETECTED 06/03/2016 2336      IMAGING I have personally reviewed the radiological images below and agree with the radiology interpretations.  Dg Chest Port 1 View 06/05/2016 IMPRESSION: Slight improvement in the appearance of the pulmonary interstitium and both lung bases. Persistent hypoinflation with mild CHF. The support tubes are in reasonable position.   Ct Head Wo Contrast 06/03/2016 Acute left PCA territory infarct with increased swelling.  Midline shift measures 6 mm; no herniation or ventricular entrapment. Hemorrhagic conversion is minimally increased, no focal hematoma.  06/01/2016 Acute left PCA territory infarction. More low-density and swelling in the region involved.  Petechial blood products are present, without frank focal hematoma.  Increased swelling results in mass effect with left-to-right shift of 5 mm. No ventricular trapping.  05/30/2016 1. Similar appearance of evolving left MCA territory infarcts involving the left basal ganglia and probable anterior left frontal lobe.  2. Subtle loss of gray-white matter differentiation within the parasagittal left tempo-occipital region, suspicious for evolving left PCA territory infarct. This is compatible with previously  identified left P2 occlusion.  3. No other new acute intracranial process identified.   05/09/2016 1. Acute evolving left basal ganglia infarct as above, with additional possible patchy involvement within the supra ganglionic anterior left frontal lobe.  2. ASPECTS is 6  3. Remote right basal ganglia infarct.  4. Mild chronic small vessel ischemic disease.   Ct Angio Head and Neck W Or Wo Contrast 05/30/2016 1. Abrupt occlusion of the proximal left P2 segment.  2. No other large or proximal arterial branch occlusion identified.  3. Short-segment severe right A2 stenosis.  4. Multifocal atheromatous irregularity involving the distal MCA branches bilaterally.  5. Short-segment atheromatous  stenosis of approximately 35% at the proximal left ICA.   MRI Brain W/o Contrast  05/30/2016 1. Moderately motion degraded examination. Acute large LEFT PCA territory infarct including LEFT thalamus with petechial hemorrhage. 2. Subacute LEFT MCA territory infarct involving LEFT basal ganglia and LEFT frontal lobe with petechial hemorrhage. 3. Old RIGHT basal ganglia infarct. 4. Mild chronic small vessel ischemic disease. Mild to moderate parenchymal brain volume loss.  EEG  06/01/16. Diffuse slowing with runs of rhythmic spikes and polyspike wave activity in the left occipital region lasting 30 seconds which may represent seizure activity  EEG 06/03/2016 Impression:  This is an abnormal EEG due to moderate to severe diffuse generalized slowing consisting of low voltage delta activity with periods of suppression. No epileptiform abnormalities are noted. There is no evidence of seizure activity on this recording. Clinical correlation: This pattern is consistent with a global encephalopathic picture, nonspecific as to etiology. This pattern can be seen with sedative medications, metabolic derangements, neurodegenerative disorders, and acute CNS insults including ischemia and infection. Clinical correlation is required.  The abnormalities seen in the left occipital lobe on previous EEG are not present on today's study.   TTE - Left ventricle: The cavity size was normal. Wall thickness was   increased in a pattern of moderate LVH. Systolic function was   normal. The estimated ejection fraction was in the range of 55%   to 60%. Wall motion was normal; there were no regional wall   motion abnormalities. Doppler parameters are consistent with   restrictive physiology, indicative of decreased left ventricular   diastolic compliance and/or increased left atrial pressure. - Mitral valve: Calcified annulus. - Left atrium: The atrium was severely dilated. Impressions: - Technically difficult;  definity used; normal LV systolic   function; moderate LVH; restrictive filling severe LAE.   PHYSICAL EXAM  Temp:  [98.3 F (36.8 C)-101.8 F (38.8 C)] 98.3 F (36.8 C) (01/29 0800) Pulse Rate:  [35-148] 56 (01/29 1200) Resp:  [8-31] 20 (01/29 1200) BP: (103-192)/(58-119) 127/87 (01/29 1200) SpO2:  [89 %-100 %] 96 % (01/29 1200) FiO2 (%):  [40 %-60 %] 40 % (01/29 0745)  General - obese, well developed, intubated on low dose sedation.  Ophthalmologic - Fundi not visualized  Cardiovascular - irregularly irregular heart rate and rhythm with RVR.  Neuro - intubated on low dose sedation, not responding to voice or pain, not open eyes, PERRL, left pupil 2mm, right pupil 2.9mm, reactive to light, weak and slow doll's eyes, weak corneal R>L, positive gag. On pain stimulation, no movement of all extremities. DTR diminished and no babinski. Sensation, coordination, and gait not tested.     ASSESSMENT/PLAN [name] is a 61 y.o. male with history of HTN, new onset atrial fibrillation without anticoagulation, hyperglycemia, and obesity presenting with dysarthria with right sided weakness. He received  IV t-PA Monday, 05/19/2016, at 2330.   Stroke:  Left PCA large infarct embolic secondary to atrial fibrillation not on AC.  Resultant  Intubated, in coma  MRI - Large left PCA infarct and subacute left MCA and basal ganglia and frontal lobe infarcts. Old right basal ganglia infarct.   CTA H&N - occlusion of the proximal left P2 segment.   2D Echo - EF 55% to 60%  LDL - 106  HgbA1c - 8.8   VTE prophylaxis - lovenox Diet NPO time specified  No antithrombotic prior to admission, now on ASA 325mg . No AC for now due to large left PCA infarct.  Ongoing aggressive stroke risk factor management  Therapy recommendations: pending  Disposition:  Pending  Cerebral edema  Mild mid line shift  Was on but now off 3% saline  Na 157->159->162->160->162  Free water increased to  300ml Q3h  Repeat CT head - Acute left PCA territory infarct with increased swelling. Midline shift measures 6 mm.  AMS with coma  clinical picture worse than expected from MRI findings  EEG showed runs of rhythmic spikes and polyspike wave activity in the left occipital region lasting 30 seconds  On depakote, however, developed elevated ammonia level - will dc  Ammonia 42 -> 55  depakote level 69->71->76->63  D/c depakote, increase keppra to 1000mg  bid  Repeat EEG - diffuse slow without seizure activity  Repeat EEG pending  afib with RVR  New diagnosis  Not on AC PTA  No AC for now due to large infarct  Still has RVR, on metoprolol 50mg  bid and cardizem IV drip  CXR concerning for CHF - lasix x 1  Cardiology on aboard - Cardiology called back to see pt for Afib with RVR and CHF.  Fever  Tmax 102  Leukocytosis - 15.7 -> 12.9 ->14.5  CXR - CHF and suspected asp pneumonia.  UA neg x 2  Blood Cx pending  Sputum Cx pending  On rocephin and vanco now  CCM on board  DM  A1C 8.8, goal < 7.0  SSI  Hyperglycemia on CBG monitoring  TF @ 15cc/h  increase lantus to 30 units daily  Hypertension  off Cardene now Increased metoprolol dose to 50 bid Add lisinopril 20 bid and amlodipine 10  Hyperlipidemia  Home meds:  No lipid lowering medications prior to admission.  LDL 106, goal < 70  on lipitor 40mg   Continue statin at discharge  Other Stroke Risk Factors  Advanced age  ETOH use, advised to drink no more than 1 drink per day  Obesity, Body mass index is 40.13 kg/m., recommend weight loss, diet and exercise as appropriate   Hx stroke/TIA - remote basal ganglia infarct by imaging  Other Active Problems  Hypokalemia - 3.4 ->  3.1 -> 3.3  AKI - 1.37 -> 1.06->1.30->1.37   Hospital day # 6  This patient is critically ill due to left PCA large infarct, afib with RVR, hyperglycemia, intubated and at significant risk of neurological  worsening, death form recurrent stroke, hemorrhagic transformation, heart failure, seizure. This patient's care requires constant monitoring of vital signs, hemodynamics, respiratory and cardiac monitoring, review of multiple databases, neurological assessment, discussion with family, other specialists and medical decision making of high complexity. I had long discussion with sister and son yesterday and again today with sister, informed her about pt current condition, treatment options, and potential prognosis. Family will have further discuss. I spent 45 minutes of neurocritical care time in the care of this  patient.  Marvel Plan, MD PhD Stroke Neurology 06/05/2016 12:32 PM   To contact Stroke Continuity provider, please refer to [email or web address]. After hours, contact General Neurology

## 2016-06-05 NOTE — Progress Notes (Signed)
SLP Cancellation Note  Patient Details Name: Ryan Anderson MRN: [id number] DOB: 1956/02/01   Cancelled treatment:       Reason Eval/Treat Not Completed: Medical issues which prohibited therapy. Remains intubated.   Maxcine Hamaiewonsky, Daila Elbert 06/05/2016, 10:22 AM  Maxcine HamLaura Paiewonsky, M.A. CCC-SLP [phone]

## 2016-06-05 NOTE — Progress Notes (Signed)
CRITICAL VALUE ALERT  Critical value received:  Na 162  Date of notification:  1/29  Time of notification:  2150  Critical value read back:Yes.    Nurse who received alert:  Herma ArdEllie Messer, RN  MD notified (1st page):  [place]zen  Time of first page: 2152  MD notified (2nd page):  Time of second page:  Responding MD:  Otelia LimesLindzen  Time MD responded:  2207  * No orders received

## 2016-06-05 NOTE — Progress Notes (Signed)
Patient converted back into a-fib. Heart rate sustaining 100-115

## 2016-06-05 NOTE — Progress Notes (Signed)
PULMONARY / CRITICAL CARE MEDICINE   Name: Ryan Anderson MRN: [id number] DOB: 08/26/1955    ADMISSION DATE:  06-Jun-2016 CONSULTATION DATE: 1/25  REFERRING MD: Neuro  CHIEF COMPLAINT: Stroke  BRIEF SUMMARY:  61 year old male admitted 1/22 with right-sided weakness & new onset atrial fibrillation.  The patient was found to have a new subacute PCA infarct involving the thalamus. He developed progressive lethargy and dysarthria (1/25) requiring intubation.   SUBJECTIVE:   No changes in neurostatus   Rate 115 , neg 1 liter  VITAL SIGNS: BP (!) 155/98   Pulse 90   Temp 98.3 F (36.8 C) (Axillary)   Resp 17   Ht 5' 8.9" (1.75 m)   Wt 122.9 kg (270 lb 15.1 oz)   SpO2 95%   BMI 40.13 kg/m   HEMODYNAMICS:    VENTILATOR SETTINGS: Vent Mode: PSV;CPAP FiO2 (%):  [40 %-60 %] 40 % Set Rate:  [16 bmp] 16 bmp Vt Set:  [570 mL] 570 mL PEEP:  [5 cmH20-10 cmH20] 8 cmH20 Pressure Support:  [8 cmH20] 8 cmH20 Plateau Pressure:  [18 cmH20-19 cmH20] 19 cmH20  INTAKE / OUTPUT: I/O last 3 completed shifts: In: 4326.6 [I.V.:1921.6; NG/GT:1575; IV Piggyback:830] Out: 7829 [FAOZH:0865; Stool:400]  PHYSICAL EXAMINATION: General: obese male in NAD on vent  HEENT: ett Neuro: sedate on propofol, rass -3, some int cough,. perrl rt 3 left 2 CV: s1s2 rrr, no m/r/g PULM: coarse some reduced  HQ:IONG, non-tender, bsx4 active  Extremities: warm/dry, generalized edema 2+ Skin: no rashes or lesions   LABS:  BMET  Recent Labs Lab 06/03/16 0500  06/04/16 0545  06/05/16 0245 06/05/16 0442 06/05/16 0835  NA 149*  < > 157*  < > 160* 162* 161*  K 3.4*  --  3.1*  --   --  3.3*  --   CL 114*  --  124*  --   --  125*  --   CO2 27  --  27  --   --  28  --   BUN 34*  --  41*  --   --  46*  --   CREATININE 1.06  --  1.30*  --   --  1.37*  --   GLUCOSE 170*  --  163*  --   --  175*  --   < > = values in this interval not displayed.  Electrolytes  Recent Labs Lab 06/03/16 0500 06/04/16 0545  06/05/16 0442  CALCIUM 8.5* 8.6* 8.5*    CBC  Recent Labs Lab 06/03/16 0500 06/04/16 0545 06/05/16 0442  WBC 15.7* 12.9* 14.5*  HGB 14.8 14.1 13.8  HCT 44.6 44.5 44.6  PLT 161 169 171    Coag's  Recent Labs Lab June 06, 2016 2315  APTT 30  INR 0.95    Sepsis Markers No results for input(s): LATICACIDVEN, PROCALCITON, O2SATVEN in the last 168 hours.  ABG  Recent Labs Lab 06/01/16 0115 06/01/16 1255  PHART 7.444 7.345*  PCO2ART 38.2 49.7*  PO2ART 94.7 319*    Liver Enzymes  Recent Labs Lab 06-06-2016 2315  AST 37  ALT 31  ALKPHOS 103  BILITOT 0.7  ALBUMIN 3.7    Cardiac Enzymes No results for input(s): TROPONINI, PROBNP in the last 168 hours.  Glucose  Recent Labs Lab 06/04/16 1155 06/04/16 1557 06/04/16 1941 06/04/16 2359 06/05/16 0327 06/05/16 0815  GLUCAP 226* 187* 171* 187* 186* 160*    Imaging Dg Chest Port 1 View  Result Date: 06/05/2016 CLINICAL DATA:  Acute respiratory failure, intubated patient. History of hypertension, atrial fibrillation, CVA, acute CHF and respiratory failure, morbid obesity, nonsmoker. EXAM: PORTABLE CHEST 1 VIEW COMPARISON:  Portable chest x-ray of June 04, 2016 FINDINGS: The lungs are mildly hypoinflated. Atelectasis at the right lung base has improved. Small amounts of pleural fluid are present greatest on the right. The cardiac silhouette remains enlarged. The pulmonary vascularity is less engorged and more distinct. The right-sided PICC line tip projects over the midportion of the SVC. The endotracheal tube tip lies 4 cm above the carina. The esophagogastric tube tip in proximal port lie below the GE junction. IMPRESSION: Slight improvement in the appearance of the pulmonary interstitium and both lung bases. Persistent hypoinflation with mild CHF. The support tubes are in reasonable position. Electronically Signed   By: David  SwazilandJordan M.D.   On: 06/05/2016 07:37     STUDIES:  CT head 1/25 > per radiologist. Acute  left PCA territory infarction. More low-density and swelling in the region involved. Petechial blood products are present, without frank focal hematoma. Increased swelling results in mass effect with left-to-right shift of 5 mm. No ventricular trapping. BLE Venous Duplex 1/28 >>  LUE Venous Duplex 1/28 >>   CULTURES: Tracheal aspirate 1/25 >>   ANTIBIOTICS: 1/25 zoysn >>1/29 1/28 vanco >> 1/29 1/29 ceftriaxone>>>  SIGNIFICANT EVENTS: 1/22 Admit with new AF, R sided weakness  LINES/TUBES: ETT 1/25 >> RUE PICC 1/26 >>    DISCUSSION: 61 year old male admitted 1/22 with new onset atrial fibrillation and right-sided weakness. Evaluation found him to be positive for an acute left PCA territory infarct with evidence of increasing cerebral edema on CT imaging. Patient received TPA. He had worsening lethargy and dysarthria requiring intubation. 3% hypertonic saline initiated. The patient remains in atrial fibrillation.   1. Acute PCA Infarct with Petechial Hemorrhage - s/p TPA, mgmt per Neurology, 3%NS on hold this am due to Na >155.  Continue protocol. Would NOT USE LASIX on 3% further, some free water ordered, goal 155 ASA 325 mg QD.  Propofol for sedation to full wua 2. Cerebral Edema - 3% NS as above.  Monitor serial neuro exams. Goal Na 150-155 no lasix 3. Acute Hypoxic Respiratory Failure - secondary to PCA infarct, abg last reviewed, keep same MV, weaning PS 8/ cpap 8 requried 4. Aspiration PNA / Pneumonitis - pcxr not that impressive, neuro fevers likely, culture neg, narrow to ceftraixone 5. Fever - monitor, await cultures- remain neg, narrow 6. Pleural Effusions - pcxr folow up, was neg 1 liter, would NOT use further lasix 7. Atrial Fibrillation - cardizem infusion, to max 15, consider oral load , not at goal HTn at this stage either, may need amio if BP drops 8. AKI/ hypernatremjia / hypokalemia . Hypernatremia- off 3%, free water added, NO lasix , bmet q6h for na, k supp  needed 9. HTN - SBP parameters per Neurology, cardizem 10. DM - SSI  11. Leukocytosis - likely reactive + aspiration PNA  12. Hypokalemia - replace K 13. Prophylaxis - SCD's, PPI for SUP  14. Diet - continue TF 15. Dispo - ICU, poor prognosis.  DNR per family.    CC Time: 30 minutes   Mcarthur Rossettianiel J. Tyson AliasFeinstein, MD, FACP Pgr: [phone] [facility]

## 2016-06-05 NOTE — Progress Notes (Signed)
*  PRELIMINARY RESULTS* Vascular Ultrasound Left upper extremity venous duplex has been completed.  Preliminary findings: the visualized veins of the left upper extremity appear negative for deep and superficial vein thrombosis.  Chauncey FischerCharlotte C Rhys Anchondo 06/05/2016, 2:47 PM

## 2016-06-06 ENCOUNTER — Inpatient Hospital Stay (HOSPITAL_COMMUNITY): Payer: Self-pay

## 2016-06-06 LAB — GLUCOSE, CAPILLARY
GLUCOSE-CAPILLARY: 193 mg/dL — AB (ref 65–99)
GLUCOSE-CAPILLARY: 227 mg/dL — AB (ref 65–99)
GLUCOSE-CAPILLARY: 228 mg/dL — AB (ref 65–99)
GLUCOSE-CAPILLARY: 230 mg/dL — AB (ref 65–99)
Glucose-Capillary: 194 mg/dL — ABNORMAL HIGH (ref 65–99)
Glucose-Capillary: 218 mg/dL — ABNORMAL HIGH (ref 65–99)
Glucose-Capillary: 256 mg/dL — ABNORMAL HIGH (ref 65–99)

## 2016-06-06 LAB — SODIUM
SODIUM: 151 mmol/L — AB (ref 135–145)
SODIUM: 154 mmol/L — AB (ref 135–145)
Sodium: 158 mmol/L — ABNORMAL HIGH (ref 135–145)

## 2016-06-06 LAB — CBC
HEMATOCRIT: 44.7 % (ref 39.0–52.0)
Hemoglobin: 14 g/dL (ref 13.0–17.0)
MCH: 31.4 pg (ref 26.0–34.0)
MCHC: 31.3 g/dL (ref 30.0–36.0)
MCV: 100.2 fL — AB (ref 78.0–100.0)
Platelets: 180 10*3/uL (ref 150–400)
RBC: 4.46 MIL/uL (ref 4.22–5.81)
RDW: 13.5 % (ref 11.5–15.5)
WBC: 12.3 10*3/uL — ABNORMAL HIGH (ref 4.0–10.5)

## 2016-06-06 LAB — BASIC METABOLIC PANEL
ANION GAP: 8 (ref 5–15)
BUN: 48 mg/dL — ABNORMAL HIGH (ref 6–20)
CALCIUM: 8.9 mg/dL (ref 8.9–10.3)
CHLORIDE: 124 mmol/L — AB (ref 101–111)
CO2: 29 mmol/L (ref 22–32)
CREATININE: 1.18 mg/dL (ref 0.61–1.24)
GFR calc Af Amer: >60 mL/min (ref >60)
GFR calc non Af Amer: >60 mL/min (ref >60)
GLUCOSE: 227 mg/dL — AB (ref 65–99)
Potassium: 3.9 mmol/L (ref 3.5–5.1)
Sodium: 161 mmol/L (ref 135–145)

## 2016-06-06 LAB — AMMONIA: Ammonia: 50 umol/L — ABNORMAL HIGH (ref 9–35)

## 2016-06-06 MED ORDER — INSULIN GLARGINE 100 UNIT/ML ~~LOC~~ SOLN
8.0000 [IU] | Freq: Every day | SUBCUTANEOUS | Status: DC
  Filled 2016-06-06: qty 0.08

## 2016-06-06 MED ORDER — DEXTROSE 5 % IV SOLN
INTRAVENOUS | Status: DC
  Administered 2016-06-06: 18:00:00 via INTRAVENOUS

## 2016-06-06 MED ORDER — METOPROLOL TARTRATE 50 MG PO TABS
75.0000 mg | ORAL_TABLET | Freq: Two times a day (BID) | ORAL | Status: DC
  Administered 2016-06-06 – 2016-06-07 (×3): 75 mg via ORAL
  Filled 2016-06-06 (×3): qty 1

## 2016-06-06 MED ORDER — LABETALOL HCL 100 MG PO TABS: 100.0000 mg | ORAL_TABLET | Freq: Two times a day (BID) | ORAL | Status: DC

## 2016-06-06 MED ORDER — PRO-STAT SUGAR FREE PO LIQD
60.0000 mL | Freq: Four times a day (QID) | ORAL | Status: DC
  Administered 2016-06-06 – 2016-06-07 (×4): 60 mL
  Filled 2016-06-06 (×4): qty 60

## 2016-06-06 MED ORDER — VITAL HIGH PROTEIN PO LIQD
1000.0000 mL | ORAL | Status: DC
  Administered 2016-06-07: 1000 mL

## 2016-06-06 NOTE — Procedures (Signed)
ELECTROENCEPHALOGRAM REPORT  Date of Study: 06/06/2016  Patient's Name: Ryan Anderson MRN: [id number] Date of Birth: 1956/04/25  Referring Provider: Rosalin Hawking, MD  Clinical History: 61 year old male with history of seizures and new onset atrial fibrillation and thalamic infarct who is intubated and poorly responsive.  Medications: acetaminophen (TYLENOL) tablet 650 mg  aspirin tablet 325 mg  atorvastatin (LIPITOR) tablet 40 mg  cefTRIAXone (ROCEPHIN) 1 g in dextrose 5 % 50 mL IVPB  chlorhexidine gluconate (MEDLINE KIT) (PERIDEX) 0.12 % solution 15 mL  diltiazem (CARDIZEM) 10 mg/ml oral suspension 60 mgenoxaparin (LOVENOX) injection 40 mg   insulin aspart (novoLOG) injection 2-6 Units  insulin glargine (LANTUS) labetalol (NORMODYNE,TRANDATE) injection 20 mg levETIRAcetam (KEPPRA) 100 MG/ML solution 1,000 mg  lisinopril (PRINIVIL,ZESTRIL) tablet 20 mg  MEDLINE mouth rinse  metoprolol (LOPRESSOR) injection 5 mg  metoprolol tartrate (LOPRESSOR) tablet 75 mg  nicardipine (CARDENE) '40mg'$  in 0.83% saline 241m IV DOUBLE STRENGTH infusion (0.2 mg/ml)  pantoprazole sodium (PROTONIX) 40 mg/20 mL oral suspension 40 mg  propofol (DIPRIVAN) 1000 MG/100ML infusion  Technical Summary: A multichannel digital EEG recording measured by the international 10-20 system with electrodes applied with paste and impedances below 5000 ohms performed as portable with EKG monitoring in an intubated and poorly responsive patient.  Hyperventilation and photic stimulation were not performed.  The digital EEG was referentially recorded, reformatted, and digitally filtered in a variety of bipolar and referential montages for optimal display.   Description: The patient is intubated and off sedation.  He is lethargic and poorly responsive during the recording.  There is symmetric low voltage  4-5 Hz theta and 2-3 Hz delta slowing of the waking background.  There is no discernible posterior dominant rhythm.  There is  no reaction of the EEG background to stimulation.  The patient does exhibit spontaneous head movements.  There were no epileptiform discharges or electrographic seizures seen.    EKG lead demonstrates irregular rate and rhythm.  Impression: This EEG is abnormal due to diffuse slowing of the background, which appears nonreactive to stimulation .  Clinical Correlation of the above findings indicates diffuse cerebral dysfunction that is non-specific in etiology and can be seen with hypoxic/ischemic injury, toxic/metabolic encephalopathies, neurodegenerative disorders, or medication effect.  The absence of epileptiform discharges does not rule out a clinical diagnosis of epilepsy.  Clinical correlation is advised.   AMetta Clines DO

## 2016-06-06 NOTE — Progress Notes (Signed)
EEG Completed; Results Pending  

## 2016-06-06 NOTE — Progress Notes (Signed)
Dr. Belia HemanKasa notified of elevated CBG's. Order to continue with current plan of care. No new orders.

## 2016-06-06 NOTE — Progress Notes (Signed)
Met with pt's sister to offer support; pt remains unresponsive.  She states pt's daughter in [place] to arrive towards the end of the week to assist with decision making.  She is very concerned about Ryan Anderson, pt's son.  Adam's sister was originally going to take him back with her to [facility], but now has changed her mind.  Pt's sister is uncertain that Ryan Anderson is able to live by himself.  She lives near him, and can offer some assist, but she is currently caring for her mother in law, and cannot take on any more.  She states he is very smart, but is unable to pay bills, and sometimes forgets normal everyday things, like tying his shoes.  Will consult CSW to see if there are any resources for this family.    Reinaldo Raddle, RN, BSN  Trauma/Neuro ICU Case Manager [phone]

## 2016-06-06 NOTE — Progress Notes (Signed)
PULMONARY / CRITICAL CARE MEDICINE   Name: Ryan Anderson MRN: [id number] DOB: 1955/07/30    ADMISSION DATE:  05/28/2016 CONSULTATION DATE: 1/25  REFERRING MD: Neuro  CHIEF COMPLAINT: Stroke  BRIEF SUMMARY:  61 year old male admitted 1/22 with right-sided weakness & new onset atrial fibrillation.  The patient was found to have a new subacute PCA infarct involving the thalamus. He developed progressive lethargy and dysarthria (1/25) requiring intubation.   SUBJECTIVE:   Weaning this am , Na remains 162  VITAL SIGNS: BP (!) 169/82   Pulse 84   Temp 98.6 F (37 C) (Axillary)   Resp 14   Ht 5' 8.9" (1.75 m)   Wt 121.9 kg (268 lb 11.9 oz)   SpO2 99%   BMI 39.80 kg/m   HEMODYNAMICS:    VENTILATOR SETTINGS: Vent Mode: PRVC FiO2 (%):  [40 %] 40 % Set Rate:  [16 bmp] 16 bmp Vt Set:  [570 mL] 570 mL PEEP:  [8 cmH20] 8 cmH20 Pressure Support:  [8 cmH20] 8 cmH20 Plateau Pressure:  [10 cmH20-17 cmH20] 13 cmH20  INTAKE / OUTPUT: I/O last 3 completed shifts: In: 4825.9 [I.V.:418.4; NG/GT:3850; IV Piggyback:557.5] Out: 4380 [Urine:3205; Stool:1175]  PHYSICAL EXAMINATION: General: obese male in NAD on vent  HEENT: ett, obese neck Neuro: off prop, not fc, not awake, not moving ext, has cough, has gag CV: s1s2 rrr, no m/r/g PULM: coarse crackles WU:XLKGGI:soft, non-tender, bsx4 active  Extremities: warm/dry, generalized edema 2+ Skin: no rashes or lesions   LABS:  BMET  Recent Labs Lab 06/04/16 0545  06/05/16 0442  06/05/16 1337 06/05/16 2108 06/06/16 0240  NA 157*  < > 162*  < > 159* 162* 161*  K 3.1*  --  3.3*  --   --   --  3.9  CL 124*  --  125*  --   --   --  124*  CO2 27  --  28  --   --   --  29  BUN 41*  --  46*  --   --   --  48*  CREATININE 1.30*  --  1.37*  --   --   --  1.18  GLUCOSE 163*  --  175*  --   --   --  227*  < > = values in this interval not displayed.  Electrolytes  Recent Labs Lab 06/04/16 0545 06/05/16 0442 06/06/16 0240  CALCIUM 8.6*  8.5* 8.9    CBC  Recent Labs Lab 06/04/16 0545 06/05/16 0442 06/06/16 0240  WBC 12.9* 14.5* 12.3*  HGB 14.1 13.8 14.0  HCT 44.5 44.6 44.7  PLT 169 171 180    Coag's No results for input(s): APTT, INR in the last 168 hours.  Sepsis Markers No results for input(s): LATICACIDVEN, PROCALCITON, O2SATVEN in the last 168 hours.  ABG  Recent Labs Lab 06/01/16 0115 06/01/16 1255  PHART 7.444 7.345*  PCO2ART 38.2 49.7*  PO2ART 94.7 319*    Liver Enzymes No results for input(s): AST, ALT, ALKPHOS, BILITOT, ALBUMIN in the last 168 hours.  Cardiac Enzymes No results for input(s): TROPONINI, PROBNP in the last 168 hours.  Glucose  Recent Labs Lab 06/05/16 0815 06/05/16 1154 06/05/16 1533 06/05/16 1954 06/06/16 0010 06/06/16 0338  GLUCAP 160* 231* 265* 209* 193* 194*    Imaging No results found.   STUDIES:  CT head 1/25 > per radiologist. Acute left PCA territory infarction. More low-density and swelling in the region involved. Petechial blood products are present,  without frank focal hematoma. Increased swelling results in mass effect with left-to-right shift of 5 mm. No ventricular trapping. BLE Venous Duplex 1/28 >>  LUE Venous Duplex 1/28 >>  LEFT lower 1/29>>>?mobile thrombosis seen near origin of left greater saphenous vein in the common femoral vein.  CULTURES: Tracheal aspirate 1/25 >>   ANTIBIOTICS: 1/25 zoysn >>1/29 1/28 vanco >> 1/29 1/29 ceftriaxone>>>  SIGNIFICANT EVENTS: 1/22 Admit with new AF, R sided weakness  LINES/TUBES: ETT 1/25 >> RUE PICC 1/26 >>    DISCUSSION: 61 year old male admitted 1/22 with new onset atrial fibrillation and right-sided weakness. Evaluation found him to be positive for an acute left PCA territory infarct with evidence of increasing cerebral edema on CT imaging. Patient received TPA. He had worsening lethargy and dysarthria requiring intubation. 3% hypertonic saline initiated. The patient remains in atrial  fibrillation.   1. Acute PCA Infarct with Petechial Hemorrhage -  -if DVT confirmed, reluctant to use heparin, would place filter, await confirmation fo result from vascular surgery read,  -3% off, correct Na slight and repeat assessment neurostatsu -prognosis concerning  2. Cerebral Edema - 3% NS off, na correction further needed, consider d5w at 40 x 500 cc 3. Acute Hypoxic Respiratory Failure - peep to 5, then cpap 5 ps 5, goal 2 hours, unable to extubate with neurostatus, will d/w daughter in am to confirm no trach option, pcxr without infiltrate 4. Aspiration PNA / Pneumonitis - not impressed, consider shorter coure abx 5 days 5. Fever - improved 6. Pleural Effusions - pcxr folow up, was neg 1 liter, would NOT use further lasix 7. Atrial Fibrillation - cardizem infusion off as responded to oral agents, BP is also in range 8. AKI/ hypernatremjia / hypokalemia . Hypernatremia- I want to add d5w at 30-40 x 500 cc will d/w neuro 9. HTN - SBP parameters per Neurology, cardizem oral successful, dc norvasc as does not add any BP control, consider addition BB oral also 10. DM - SSI, add lantus , glu 190-200 11. Hypokalemia - resolved 12. Prophylaxis - SCD's, PPI for SUP  13. Diet - continue TF, monitor BM- noted 14. Dispo - ICU, poor prognosis.  DNR per family. Calling duaghter in am to discuss plan of care   CC Time: 30 minutes   Mcarthur Rossetti. Tyson Alias, MD, FACP Pgr: [phone] [facility]

## 2016-06-06 NOTE — Progress Notes (Signed)
Nutrition Follow-up  DOCUMENTATION CODES:   Morbid obesity  INTERVENTION:   Increase Vital High Protein to 35 ml/hr (840 ml/day) Decrease Prostat to 60 ml QID Provides: 1640 kcal, 193 grams protein, and 702 ml H2O.    NUTRITION DIAGNOSIS:   Inadequate oral intake related to inability to eat as evidenced by NPO status. Ongoing.   GOAL:   Provide needs based on ASPEN/SCCM guidelines Progressing.   MONITOR:   TF tolerance, I & O's, Vent status  ASSESSMENT:   61 Y/O with obesity, acute left basal ganglia infarct s/p TPA.  Pt remains intubated on ventilator support Spoke with RN at bedside.   Propofol: off as of 1/29 Free water: 300 ml every 3 hours = 2400 ml CBG's: [phone] Na 151  Diet Order:  Diet NPO time specified Vital High Protein @ 15 ml/hr with 60 ml Prostat five times per day (1360 kcal and 181 grams protein)  Skin:  Reviewed, no issues  Last BM:  1/31 200 ml via rectal pouch  Height:   Ht Readings from Last 1 Encounters:  05/19/2016 5' 8.9" (1.75 m)    Weight:   Wt Readings from Last 1 Encounters:  06/06/16 268 lb 11.9 oz (121.9 kg)    Ideal Body Weight:  72.7 kg  BMI:  Body mass index is 39.8 kg/m.  Estimated Nutritional Needs:   Kcal:  [id number]  Protein:  >/= 181 grams  Fluid:  > 2 L/day  EDUCATION NEEDS:   No education needs identified at this time  Kendell BaneHeather Corbett Moulder RD, LDN, CNSC [phone] Pager [phone] After Hours Pager

## 2016-06-06 NOTE — Progress Notes (Signed)
RT note: ETT holder changed, with assistance of RN, without complications.

## 2016-06-06 NOTE — Progress Notes (Signed)
Noted medical plan of care. I will sign off at this time. Please re consult as appropriate. [phone]

## 2016-06-06 NOTE — Progress Notes (Signed)
STROKE TEAM PROGRESS NOTE   SUBJECTIVE (INTERVAL HISTORY) Pt sister is at bedside. I had long discussion with family (sister and son) at bedside yesterday. They are discussing with pt daughter regarding further plan including trach, PEG, palliative care, etc. Pt still has fever overnight, afib RVR and put on cardizem drip. On vanco and zosyn, still not responsive, on propofol, but off cardene.    OBJECTIVE Temp:  [98.6 F (37 C)-100.7 F (38.2 C)] 100.7 F (38.2 C) (01/30 1200) Pulse Rate:  [63-139] 70 (01/30 1600) Cardiac Rhythm: Atrial fibrillation (01/30 0800) Resp:  [13-30] 16 (01/30 1600) BP: (132-202)/(76-119) 169/88 (01/30 1600) SpO2:  [95 %-100 %] 98 % (01/30 1600) FiO2 (%):  [30 %-40 %] 30 % (01/30 1552) Weight:  [268 lb 11.9 oz (121.9 kg)] 268 lb 11.9 oz (121.9 kg) (01/30 0500)  CBC:   Recent Labs Lab 06/05/16 0442 06/06/16 0240  WBC 14.5* 12.3*  HGB 13.8 14.0  HCT 44.6 44.7  MCV 100.9* 100.2*  PLT 171 180    Basic Metabolic Panel:   Recent Labs Lab 06/05/16 0442  06/06/16 0240 06/06/16 0845 06/06/16 1433  NA 162*  < > 161* 151* 158*  K 3.3*  --  3.9  --   --   CL 125*  --  124*  --   --   CO2 28  --  29  --   --   GLUCOSE 175*  --  227*  --   --   BUN 46*  --  48*  --   --   CREATININE 1.37*  --  1.18  --   --   CALCIUM 8.5*  --  8.9  --   --   < > = values in this interval not displayed.  Lipid Panel:     Component Value Date/Time   CHOL 159 05/30/2016 0340   TRIG 104 06/04/2016 0545   HDL 41 05/30/2016 0340   CHOLHDL 3.9 05/30/2016 0340   VLDL 12 05/30/2016 0340   LDLCALC 106 (H) 05/30/2016 0340   HgbA1c:  Lab Results  Component Value Date   HGBA1C 8.7 (H) 05/31/2016   Urine Drug Screen:     Component Value Date/Time   LABOPIA NONE DETECTED 2016-06-16 2336   COCAINSCRNUR NONE DETECTED 2016-06-16 2336   LABBENZ NONE DETECTED Jun 16, 2016 2336   AMPHETMU NONE DETECTED Jun 16, 2016 2336   THCU NONE DETECTED 2016-06-16 2336   LABBARB NONE  DETECTED 06/16/16 2336      IMAGING I have personally reviewed the radiological images below and agree with the radiology interpretations.  Dg Chest Port 1 View 06/05/2016 IMPRESSION: Slight improvement in the appearance of the pulmonary interstitium and both lung bases. Persistent hypoinflation with mild CHF. The support tubes are in reasonable position.   Ct Head Wo Contrast 06/03/2016 Acute left PCA territory infarct with increased swelling.  Midline shift measures 6 mm; no herniation or ventricular entrapment. Hemorrhagic conversion is minimally increased, no focal hematoma.  06/01/2016 Acute left PCA territory infarction. More low-density and swelling in the region involved.  Petechial blood products are present, without frank focal hematoma.  Increased swelling results in mass effect with left-to-right shift of 5 mm. No ventricular trapping.  05/30/2016 1. Similar appearance of evolving left MCA territory infarcts involving the left basal ganglia and probable anterior left frontal lobe.  2. Subtle loss of gray-white matter differentiation within the parasagittal left tempo-occipital region, suspicious for evolving left PCA territory infarct. This is compatible with previously identified left  P2 occlusion.  3. No other new acute intracranial process identified.   Jun 12, 2016 1. Acute evolving left basal ganglia infarct as above, with additional possible patchy involvement within the supra ganglionic anterior left frontal lobe.  2. ASPECTS is 6  3. Remote right basal ganglia infarct.  4. Mild chronic small vessel ischemic disease.   Ct Angio Head and Neck W Or Wo Contrast 05/30/2016 1. Abrupt occlusion of the proximal left P2 segment.  2. No other large or proximal arterial branch occlusion identified.  3. Short-segment severe right A2 stenosis.  4. Multifocal atheromatous irregularity involving the distal MCA branches bilaterally.  5. Short-segment atheromatous stenosis of  approximately 35% at the proximal left ICA.   MRI Brain W/o Contrast  05/30/2016 1. Moderately motion degraded examination. Acute large LEFT PCA territory infarct including LEFT thalamus with petechial hemorrhage. 2. Subacute LEFT MCA territory infarct involving LEFT basal ganglia and LEFT frontal lobe with petechial hemorrhage. 3. Old RIGHT basal ganglia infarct. 4. Mild chronic small vessel ischemic disease. Mild to moderate parenchymal brain volume loss.  EEG  06/01/16. Diffuse slowing with runs of rhythmic spikes and polyspike wave activity in the left occipital region lasting 30 seconds which may represent seizure activity  EEG 06/03/2016 Impression:  This is an abnormal EEG due to moderate to severe diffuse generalized slowing consisting of low voltage delta activity with periods of suppression. No epileptiform abnormalities are noted. There is no evidence of seizure activity on this recording. Clinical correlation: This pattern is consistent with a global encephalopathic picture, nonspecific as to etiology. This pattern can be seen with sedative medications, metabolic derangements, neurodegenerative disorders, and acute CNS insults including ischemia and infection. Clinical correlation is required.  The abnormalities seen in the left occipital lobe on previous EEG are not present on today's study.   TTE - Left ventricle: The cavity size was normal. Wall thickness was   increased in a pattern of moderate LVH. Systolic function was   normal. The estimated ejection fraction was in the range of 55%   to 60%. Wall motion was normal; there were no regional wall   motion abnormalities. Doppler parameters are consistent with   restrictive physiology, indicative of decreased left ventricular   diastolic compliance and/or increased left atrial pressure. - Mitral valve: Calcified annulus. - Left atrium: The atrium was severely dilated. Impressions: - Technically difficult; definity used;  normal LV systolic   function; moderate LVH; restrictive filling severe LAE.  EEG - diffuse cerebral dysfunction that is non-specific in etiology and can be seen with hypoxic/ischemic injury, toxic/metabolic encephalopathies, neurodegenerative disorders, or medication effect.  The absence of epileptiform discharges does not rule out a clinical diagnosis of epilepsy.  Clinical correlation is advised.   PHYSICAL EXAM  Temp:  [98.6 F (37 C)-100.7 F (38.2 C)] 100.7 F (38.2 C) (01/30 1200) Pulse Rate:  [63-139] 70 (01/30 1600) Resp:  [13-30] 16 (01/30 1600) BP: (132-202)/(76-119) 169/88 (01/30 1600) SpO2:  [95 %-100 %] 98 % (01/30 1600) FiO2 (%):  [30 %-40 %] 30 % (01/30 1552) Weight:  [268 lb 11.9 oz (121.9 kg)] 268 lb 11.9 oz (121.9 kg) (01/30 0500)  General - obese, well developed, intubated off sedation.  Ophthalmologic - Fundi not visualized  Cardiovascular - irregularly irregular heart rate and rhythm with RVR.  Neuro - intubated off sedation, not responding to voice, not open eyes, PERRL, bilateral pupil 3mm, reactive to light, weak and slow doll's eyes, brief frown action on pain stimulatin, weak corneal R>L, positive  gag. On pain stimulation, LUE briefly raise against gravity, LLE mild toe DF. No movement on RUE and RLE. DTR diminished and no babinski. Sensation, coordination, and gait not tested.     ASSESSMENT/PLAN Ryan Anderson is a 61 y.o. male with history of HTN, new onset atrial fibrillation without anticoagulation, hyperglycemia, and obesity presenting with dysarthria with right sided weakness. He received IV t-PA Monday, 06/06/2016, at 2330.   Stroke:  Left PCA large infarct embolic secondary to atrial fibrillation not on AC.  Resultant  Intubated, in coma  MRI - Large left PCA infarct and subacute left MCA and basal ganglia and frontal lobe infarcts. Old right basal ganglia infarct.   CTA H&N - occlusion of the proximal left P2 segment.   2D Echo - EF 55%  to 60%  LDL - 106  HgbA1c - 8.8   VTE prophylaxis - lovenox Diet NPO time specified  No antithrombotic prior to admission, now on ASA 325mg . No AC for now due to large left PCA infarct.  Ongoing aggressive stroke risk factor management  Therapy recommendations: pending  Disposition:  Pending  Cerebral edema  Mild mid line shift  Was on but now off 3% saline  Na 157->159->162->160->162->158  Free water increased to 300ml Q3h  Repeat CT head - Acute left PCA territory infarct with increased swelling. Midline shift measures 6 mm.  AMS with coma  clinical picture worse than expected from MRI findings  EEG showed runs of rhythmic spikes and polyspike wave activity in the left occipital region lasting 30 seconds  depakote discontinued due to elevated ammonia level  Ammonia 42 -> 55 -> 50  depakote level 69->71->76->63  On keppra to 1000mg  bid  Repeat EEG x 2 - diffuse slow without seizure activity  afib with RVR  New diagnosis  Not on AC PTA  No AC for now due to large infarct  Still has RVR, on metoprolol 75 mg bid and cardizem 60mg  Q6h  CXR - RLL pneumonia  Fever  Tmax 100.7  Leukocytosis - 15.7 -> 12.9 ->14.5->12.3  CXR - CHF and suspected asp pneumonia.  UA neg x 2  Blood Cx NGTD  Sputum Cx NGTD  On rocephin now  CCM on board  DM - not in control  A1C 8.8, goal < 7.0  SSI  Hyperglycemia still on CBG monitoring  TF @ 15cc/h  increase lantus to 30 units daily  Hypertension  off Cardene now Increased metoprolol dose to 75 bid On lisinopril 20 bid  Hyperlipidemia  Home meds:  No lipid lowering medications prior to admission.  LDL 106, goal < 70  on lipitor 40mg   Continue statin at discharge  Other Stroke Risk Factors  Advanced age  ETOH use, advised to drink no more than 2 drink per day  Obesity, Body mass index is 39.8 kg/m., recommend weight loss, diet and exercise as appropriate   Hx stroke/TIA - remote basal  ganglia infarct by imaging  Other Active Problems  Hypokalemia - 3.4 ->  3.1 -> 3.3->3.9  AKI - 1.37 -> 1.06->1.30->1.37 ->1.18   Hospital day # 7  This patient is critically ill due to left PCA large infarct, afib with RVR, hyperglycemia, intubated and at significant risk of neurological worsening, death form recurrent stroke, hemorrhagic transformation, heart failure, seizure. This patient's care requires constant monitoring of vital signs, hemodynamics, respiratory and cardiac monitoring, review of multiple databases, neurological assessment, discussion with family, other specialists and medical decision making of high complexity. I  had long discussion with sister and son today, informed her about pt current condition, treatment options, and potential prognosis. Family wants to decide on Thursday. I spent 40 minutes of neurocritical care time in the care of this patient.  Marvel Plan, MD PhD Stroke Neurology 06/06/2016 4:41 PM   To contact Stroke Continuity provider, please refer to [email or web address]. After hours, contact General Neurology

## 2016-06-07 LAB — CBC
HEMATOCRIT: 45.1 % (ref 39.0–52.0)
Hemoglobin: 14.1 g/dL (ref 13.0–17.0)
MCH: 31.1 pg (ref 26.0–34.0)
MCHC: 31.3 g/dL (ref 30.0–36.0)
MCV: 99.3 fL (ref 78.0–100.0)
Platelets: 176 10*3/uL (ref 150–400)
RBC: 4.54 MIL/uL (ref 4.22–5.81)
RDW: 13.3 % (ref 11.5–15.5)
WBC: 12.6 10*3/uL — AB (ref 4.0–10.5)

## 2016-06-07 LAB — TRIGLYCERIDES: Triglycerides: 161 mg/dL — ABNORMAL HIGH (ref <150)

## 2016-06-07 LAB — BASIC METABOLIC PANEL
Anion gap: 9 (ref 5–15)
BUN: 41 mg/dL — ABNORMAL HIGH (ref 6–20)
CHLORIDE: 117 mmol/L — AB (ref 101–111)
CO2: 29 mmol/L (ref 22–32)
Calcium: 8.4 mg/dL — ABNORMAL LOW (ref 8.9–10.3)
Creatinine, Ser: 1.1 mg/dL (ref 0.61–1.24)
Glucose, Bld: 222 mg/dL — ABNORMAL HIGH (ref 65–99)
POTASSIUM: 3.4 mmol/L — AB (ref 3.5–5.1)
SODIUM: 155 mmol/L — AB (ref 135–145)

## 2016-06-07 LAB — CULTURE, RESPIRATORY

## 2016-06-07 LAB — SODIUM
Sodium: 156 mmol/L — ABNORMAL HIGH (ref 135–145)
Sodium: 154 mmol/L — ABNORMAL HIGH (ref 135–145)
Sodium: 153 mmol/L — ABNORMAL HIGH (ref 135–145)

## 2016-06-07 LAB — CULTURE, RESPIRATORY W GRAM STAIN

## 2016-06-07 LAB — GLUCOSE, CAPILLARY
GLUCOSE-CAPILLARY: 223 mg/dL — AB (ref 65–99)
GLUCOSE-CAPILLARY: 254 mg/dL — AB (ref 65–99)
GLUCOSE-CAPILLARY: 253 mg/dL — AB (ref 65–99)
Glucose-Capillary: 255 mg/dL — ABNORMAL HIGH (ref 65–99)

## 2016-06-07 LAB — AMMONIA: Ammonia: 40 umol/L — ABNORMAL HIGH (ref 9–35)

## 2016-06-07 MED ORDER — VANCOMYCIN HCL IN DEXTROSE 1-5 GM/200ML-% IV SOLN
1000.0000 mg | Freq: Two times a day (BID) | INTRAVENOUS | Status: DC
  Filled 2016-06-07: qty 200

## 2016-06-07 MED ORDER — BIOTENE DRY MOUTH MT LIQD: 15.0000 mL | OROMUCOSAL | Status: DC | PRN

## 2016-06-07 MED ORDER — HALOPERIDOL LACTATE 2 MG/ML PO CONC
0.5000 mg | ORAL | Status: DC | PRN
  Filled 2016-06-07: qty 0.3

## 2016-06-07 MED ORDER — GLYCOPYRROLATE 1 MG PO TABS: 1.0000 mg | ORAL_TABLET | ORAL | Status: DC | PRN

## 2016-06-07 MED ORDER — ONDANSETRON 4 MG PO TBDP: 4.0000 mg | ORAL_TABLET | Freq: Four times a day (QID) | ORAL | Status: DC | PRN

## 2016-06-07 MED ORDER — LORAZEPAM BOLUS VIA INFUSION
2.0000 mg | INTRAVENOUS | Status: DC | PRN
  Filled 2016-06-07: qty 5

## 2016-06-07 MED ORDER — ATORVASTATIN CALCIUM 40 MG PO TABS: 40.0000 mg | ORAL_TABLET | Freq: Every day | ORAL | Status: DC

## 2016-06-07 MED ORDER — SCOPOLAMINE 1 MG/3DAYS TD PT72
1.0000 | MEDICATED_PATCH | TRANSDERMAL | Status: DC
  Administered 2016-06-07: 1.5 mg via TRANSDERMAL
  Filled 2016-06-07: qty 1

## 2016-06-07 MED ORDER — ONDANSETRON HCL 4 MG/2ML IJ SOLN: 4.0000 mg | Freq: Four times a day (QID) | INTRAMUSCULAR | Status: DC | PRN

## 2016-06-07 MED ORDER — DILTIAZEM 12 MG/ML ORAL SUSPENSION
60.0000 mg | Freq: Four times a day (QID) | ORAL | Status: DC
  Filled 2016-06-07: qty 6

## 2016-06-07 MED ORDER — LISINOPRIL 20 MG PO TABS: 20.0000 mg | ORAL_TABLET | Freq: Two times a day (BID) | ORAL | Status: DC

## 2016-06-07 MED ORDER — ACETAMINOPHEN 650 MG RE SUPP: 650.0000 mg | Freq: Four times a day (QID) | RECTAL | Status: DC | PRN

## 2016-06-07 MED ORDER — GLYCOPYRROLATE 0.2 MG/ML IJ SOLN: 0.2000 mg | INTRAMUSCULAR | Status: DC | PRN

## 2016-06-07 MED ORDER — ACETAMINOPHEN 325 MG PO TABS: 650.0000 mg | ORAL_TABLET | Freq: Four times a day (QID) | ORAL | Status: DC | PRN

## 2016-06-07 MED ORDER — HALOPERIDOL LACTATE 5 MG/ML IJ SOLN: 0.5000 mg | INTRAMUSCULAR | Status: DC | PRN

## 2016-06-07 MED ORDER — LORAZEPAM 2 MG/ML IJ SOLN
5.0000 mg/h | INTRAVENOUS | Status: DC
  Administered 2016-06-07: 5 mg/h via INTRAVENOUS
  Filled 2016-06-07 (×2): qty 25

## 2016-06-07 MED ORDER — MORPHINE BOLUS VIA INFUSION
5.0000 mg | INTRAVENOUS | Status: DC | PRN
  Filled 2016-06-07: qty 20

## 2016-06-07 MED ORDER — SODIUM CHLORIDE 0.9 % IV SOLN
1.0000 mg/h | INTRAVENOUS | Status: DC
  Administered 2016-06-07: 4 mg/h via INTRAVENOUS
  Filled 2016-06-07: qty 10

## 2016-06-07 MED ORDER — VANCOMYCIN HCL 10 G IV SOLR
2000.0000 mg | Freq: Once | INTRAVENOUS | Status: AC
  Administered 2016-06-07: 2000 mg via INTRAVENOUS
  Filled 2016-06-07: qty 2000

## 2016-06-07 MED ORDER — METOPROLOL TARTRATE 50 MG PO TABS: 75.0000 mg | ORAL_TABLET | Freq: Two times a day (BID) | ORAL | Status: DC

## 2016-06-07 MED ORDER — SODIUM CHLORIDE 0.9 % IV SOLN: 10.0000 mg/h | INTRAVENOUS | Status: DC

## 2016-06-07 MED ORDER — POLYVINYL ALCOHOL 1.4 % OP SOLN
1.0000 [drp] | Freq: Four times a day (QID) | OPHTHALMIC | Status: DC | PRN
  Filled 2016-06-07: qty 15

## 2016-06-07 MED ORDER — HALOPERIDOL 0.5 MG PO TABS
0.5000 mg | ORAL_TABLET | ORAL | Status: DC | PRN
  Filled 2016-06-07: qty 1

## 2016-06-08 NOTE — Procedures (Signed)
Extubation Procedure Note  Patient Details:   Name: Ryan Anderson DOB: Jun 07, 1955 MRN: [id number]   Pt extubated per MD order.       Cherylin MylarDoyle, Murel Shenberger May 29, 2016, 5:28 PM

## 2016-06-08 NOTE — Progress Notes (Signed)
Notified by ID that patient's respiratory culture came back positive for MRSA. Patient placed on contact precautions. MD notified and pt's family will be educated. [name], [name], RN

## 2016-06-08 NOTE — Progress Notes (Signed)
Pharmacy Antibiotic Note  Ryan Anderson is a 61 y.o. male admitted on April 16, 2017 with pneumonia.  Pharmacy has been consulted for vancomycin dosing.  Patient has been on ceftriaxone but grew MRSA in trach aspirate. Was previously on vancomycin but stopped on 1/29. Will reload with 2g of vancomycin.  Plan: Vancomycin 1g IV every 12 hours.  Goal trough 15-20 mcg/mL.  [facility] culture data, renal function and [facility]  Height: 5' 8.9" (175 cm) Weight: 265 lb 14 oz (120.6 kg) IBW/kg (Calculated) : 70.47  Temp (24hrs), Avg:100.1 F (37.8 C), Min:98.9 F (37.2 C), Max:100.8 F (38.2 C)   Recent Labs Lab 06/03/16 0500 06/04/16 0545 06/05/16 0442 06/06/16 0240 05/22/2016 0315  WBC 15.7* 12.9* 14.5* 12.3* 12.6*  CREATININE 1.06 1.30* 1.37* 1.18 1.10    Estimated Creatinine Clearance: 89.1 mL/min (by C-G formula based on SCr of 1.1 mg/dL).    No Known Allergies  Antimicrobials this admission: Zosyn 1/25>>1/29 Vancomycin 1/28>>1/29; 1/31>> Ceftriaxone 1/29>>1/31  Dose adjustments this admission: n/a  Microbiology results: 1/23 MRSA PCR: neg 1/25 TA: neg 1/28 Blood x2: sent 1/28 TA: MRSA  Arlean Hoppingorey M. Newman PiesBall, PharmD, BCPS Clinical Pharmacist [phone] 05/16/2016 10:45 AM

## 2016-06-08 NOTE — Progress Notes (Signed)
Patient extubated. Morphine and Ativan are running. Patient seems comfortable. Family and chaplain at the bedside. Will continue to monitor quietly. Ryan Anderson, [name], RN

## 2016-06-08 NOTE — Progress Notes (Signed)
Patient's sister updated of daughter's decision. She stated she will want to be here for the withdraw process and will bring patient's son Madelaine Bhatdam if he wishes. Comfort care measures started. CCM updated and will put in orders for extubation. Respiratory updated as well. Waiting for family to arrive. Simmie Garin, [name], RN

## 2016-06-08 NOTE — Progress Notes (Signed)
PULMONARY / CRITICAL CARE MEDICINE   Name: Ryan LatinSamuel G Lejeune MRN: [id number] DOB: 1956-04-24    ADMISSION DATE:  05/28/2016 CONSULTATION DATE:  1/22  REFERRING MD:  Dr. Roda ShuttersXu  CHIEF COMPLAINT:  CVA  Brief:   61 year old male admitted 1/22 with right-sided weakness & new onset atrial fibrillation.  The patient was found to have a new subacute PCA infarct involving the thalamus. He developed progressive lethargy and dysarthria (1/25) requiring intubation.  SUBJECTIVE:  Off Sedation, no events overnight  VITAL SIGNS: BP (!) 170/92   Pulse (!) 49   Temp (!) 100.8 F (38.2 C) (Axillary)   Resp 10   Ht 5' 8.9" (1.75 m)   Wt 120.6 kg (265 lb 14 oz)   SpO2 98%   BMI 39.38 kg/m   HEMODYNAMICS:    VENTILATOR SETTINGS: Vent Mode: PSV;CPAP FiO2 (%):  [30 %] 30 % Set Rate:  [16 bmp] 16 bmp Vt Set:  [570 mL] 570 mL PEEP:  [5 cmH20] 5 cmH20 Pressure Support:  [5 cmH20] 5 cmH20 Plateau Pressure:  [16 cmH20-18 cmH20] 16 cmH20  INTAKE / OUTPUT: I/O last 3 completed shifts: In: 3709.2 [I.V.:684.2; NG/GT:2975; IV Piggyback:50] Out: 3850 [Urine:3250; Stool:600]  PHYSICAL EXAMINATION: General: Adult male, no distress, lying in bed  Neuro: Obtunded, grimices to pain,  Pupils 3mm brisk, +gag HEENT:  ETT in place Cardiovascular:  No MRG, RRR, NI S1/S2 Lungs:  Diminished at bases, non-labored   Abdomen: active bowel sounds, non-tender, non-distended  Musculoskeletal:  No deformities  Skin:  Warm, dry, intact   LABS:  BMET  Recent Labs Lab 06/05/16 0442  06/06/16 0240  04-09-2017 0245 04-09-2017 0315 04-09-2017 0751  NA 162*  < > 161*  < > 156* 155* 154*  K 3.3*  --  3.9  --   --  3.4*  --   CL 125*  --  124*  --   --  117*  --   CO2 28  --  29  --   --  29  --   BUN 46*  --  48*  --   --  41*  --   CREATININE 1.37*  --  1.18  --   --  1.10  --   GLUCOSE 175*  --  227*  --   --  222*  --   < > = values in this interval not displayed.  Electrolytes  Recent Labs Lab  06/05/16 0442 06/06/16 0240 04-09-2017 0315  CALCIUM 8.5* 8.9 8.4*    CBC  Recent Labs Lab 06/05/16 0442 06/06/16 0240 04-09-2017 0315  WBC 14.5* 12.3* 12.6*  HGB 13.8 14.0 14.1  HCT 44.6 44.7 45.1  PLT 171 180 176    Coag's No results for input(s): APTT, INR in the last 168 hours.  Sepsis Markers No results for input(s): LATICACIDVEN, PROCALCITON, O2SATVEN in the last 168 hours.  ABG  Recent Labs Lab 06/01/16 0115 06/01/16 1255  PHART 7.444 7.345*  PCO2ART 38.2 49.7*  PO2ART 94.7 319*    Liver Enzymes No results for input(s): AST, ALT, ALKPHOS, BILITOT, ALBUMIN in the last 168 hours.  Cardiac Enzymes No results for input(s): TROPONINI, PROBNP in the last 168 hours.  Glucose  Recent Labs Lab 06/06/16 1146 06/06/16 1621 06/06/16 2029 06/06/16 2327 04-09-2017 0337 04-09-2017 0843  GLUCAP 228* 227* 256* 230* 223* 254*    Imaging No results found.   STUDIES:  CT head 1/25 > per radiologist. Acute left PCA territory infarction. More low-density and swelling  in the region involved. Petechial blood products are present, without frank focal hematoma. Increased swelling results in mass effect with left-to-right shift of 5 mm. No ventricular trapping. BLE Venous Duplex 1/28 >>  LUE Venous Duplex 1/28 >>  LEFT lower 1/29>>>?mobile thrombosis seen near origin of left greater saphenous vein in the common femoral vein.  CULTURES: Tracheal aspirate 1/25 > MRSA   ANTIBIOTICS: 1/25 zoysn >>1/29 1/28 vanco >> 1/29 1/29 ceftriaxone>>>  SIGNIFICANT EVENTS: 1/22 Admit with new AF, R sided weakness  LINES/TUBES: ETT 1/25 >> RUE PICC 1/26 >>   ASSESSMENT / PLAN:  PULMONARY A: Acute Hypoxic Respiratory Failure secondary to possible aspiration PNA MRSA PNA  P:   Wean as tolerated - mental status barrier to extubation  Vent support  Follow CXR  Xopenex PRN   CARDIOVASCULAR A:  Afib HTN P:  Cardiac Monitoring  Continue ASA and Lipitor  Continue  Cardizem and Lopressor  Continue Lisinopril  Lopressor PRN for HR >100  RENAL A:   Acute Kidney Injury  Hypernatremia  Hypokaliemia  P:   Replace Electrolytes as needed  Trend BMP  Continue D5 @ 50 ml/hr   GASTROINTESTINAL A:   No issues  P:   TF per nutrition  PPI   HEMATOLOGIC A:   No issues  P:  Trend CBC  INFECTIOUS A:   MRSA PNA  P:   Trend Fever and WBC curve  Continue Rocephin  Re-start Vancomycin   ENDOCRINE A:   DM P:   SSI  Continue Lantus   NEUROLOGIC A:   Acute Encephalopathy secondary to PCA infarct  PCA infarct with Petechial Hemorrhage   Cerebral Edema  P:   Neuro following  Family goals of care discussion - agreeing with palliative care  RASS goal: 0    FAMILY  - Updates: Sister and son at bedside, agree with palliative care, daughter trying to arrange transportation to hospital   - Inter-disciplinary family meet or Palliative Care meeting due by:  Ongoing     Jovita Kussmaul, AG-ACNP [facility]  Pgr: [phone]  PCCM Pgr: [phone]  STAFF NOTE: Cindi Carbon, MD FACP have personally reviewed patient's available data, including medical history, events of note, physical examination and test results as part of my evaluation. I have discussed with resident/NP and other care providers such as pharmacist, RN and RRT. In addition, I personally evaluated patient and elicited key findings of: not awake, ronchi, secretions increased, MRSA noted sputum, last pcxr with slight rt base hazziness, reasonable to start IV vanc and follow pcxr and vent needs, peep NOT needed to increase nor other vent settings, neuro examine remains poor even after correction successful NA to 155-154 goals, weaning now PS 5 with large volumes but stil lacks airway protection skills, neuro to call daughter and discuss wishes, but family is telling me she may not be able to visit with young child and comfort care is likely the  direction for pt care, will call daughter if needed, no 3%, no lasix, no free water further The patient is critically ill with multiple organ systems failure and requires high complexity decision making for assessment and support, frequent evaluation and titration of therapies, application of advanced monitoring technologies and extensive interpretation of multiple databases.   Critical Care Time devoted to patient care services described in this note is 30 Minutes. This time reflects time of care of this signee: Rory Percy, MD FACP. This critical care time does not reflect procedure time,  or teaching time or supervisory time of PA/NP/Med student/Med Resident etc but could involve care discussion time. Rest per NP/medical resident whose note is outlined above and that I agree with   Mcarthur Rossetti. Tyson Alias, MD, FACP Pgr: [phone] [facility] 07-03-16 10:48 AM

## 2016-06-08 NOTE — Progress Notes (Signed)
STROKE TEAM PROGRESS NOTE   SUBJECTIVE (INTERVAL HISTORY) Pt sister and son are at bedside. Pt still has fever, afib RVR on cardizem drip. Off sedation but no significant neuro improvement from yesterday. Na 154 improving as well as WBC. Sister expressed possibility of withdraw care per family. She asked me to call pt daughter in [place] for decision.     OBJECTIVE Temp:  [98.9 F (37.2 C)-100.8 F (38.2 C)] 100.8 F (38.2 C) (01/31 1200) Pulse Rate:  [34-119] 89 (01/31 1500) Cardiac Rhythm: Atrial fibrillation (01/31 0800) Resp:  [9-30] 18 (01/31 1500) BP: (137-187)/(80-99) 182/98 (01/31 1500) SpO2:  [91 %-100 %] 91 % (01/31 1500) FiO2 (%):  [30 %] 30 % (01/31 1157) Weight:  [265 lb 14 oz (120.6 kg)] 265 lb 14 oz (120.6 kg) (01/31 0500)  CBC:   Recent Labs Lab 06/06/16 0240 June 17, 2016 0315  WBC 12.3* 12.6*  HGB 14.0 14.1  HCT 44.7 45.1  MCV 100.2* 99.3  PLT 180 176    Basic Metabolic Panel:   Recent Labs Lab 06/06/16 0240  06-17-2016 0315 2016/06/17 0751 17-Jun-2016 1355  NA 161*  < > 155* 154* 153*  K 3.9  --  3.4*  --   --   CL 124*  --  117*  --   --   CO2 29  --  29  --   --   GLUCOSE 227*  --  222*  --   --   BUN 48*  --  41*  --   --   CREATININE 1.18  --  1.10  --   --   CALCIUM 8.9  --  8.4*  --   --   < > = values in this interval not displayed.  Lipid Panel:     Component Value Date/Time   CHOL 159 05/30/2016 0340   TRIG 161 (H) 06/17/16 0315   HDL 41 05/30/2016 0340   CHOLHDL 3.9 05/30/2016 0340   VLDL 12 05/30/2016 0340   LDLCALC 106 (H) 05/30/2016 0340   HgbA1c:  Lab Results  Component Value Date   HGBA1C 8.7 (H) 05/31/2016   Urine Drug Screen:     Component Value Date/Time   LABOPIA NONE DETECTED 05/13/2016 2336   COCAINSCRNUR NONE DETECTED 05/21/2016 2336   LABBENZ NONE DETECTED 06/03/2016 2336   AMPHETMU NONE DETECTED 05/22/2016 2336   THCU NONE DETECTED 05/13/2016 2336   LABBARB NONE DETECTED 05/26/2016 2336      IMAGING I have  personally reviewed the radiological images below and agree with the radiology interpretations.  Dg Chest Port 1 View 06/05/2016 IMPRESSION: Slight improvement in the appearance of the pulmonary interstitium and both lung bases. Persistent hypoinflation with mild CHF. The support tubes are in reasonable position.   Ct Head Wo Contrast 06/03/2016 Acute left PCA territory infarct with increased swelling.  Midline shift measures 6 mm; no herniation or ventricular entrapment. Hemorrhagic conversion is minimally increased, no focal hematoma.  06/01/2016 Acute left PCA territory infarction. More low-density and swelling in the region involved.  Petechial blood products are present, without frank focal hematoma.  Increased swelling results in mass effect with left-to-right shift of 5 mm. No ventricular trapping.  05/30/2016 1. Similar appearance of evolving left MCA territory infarcts involving the left basal ganglia and probable anterior left frontal lobe.  2. Subtle loss of gray-white matter differentiation within the parasagittal left tempo-occipital region, suspicious for evolving left PCA territory infarct. This is compatible with previously identified left P2 occlusion.  3.  No other new acute intracranial process identified.   05/19/2016 1. Acute evolving left basal ganglia infarct as above, with additional possible patchy involvement within the supra ganglionic anterior left frontal lobe.  2. ASPECTS is 6  3. Remote right basal ganglia infarct.  4. Mild chronic small vessel ischemic disease.   Ct Angio Head and Neck W Or Wo Contrast 05/30/2016 1. Abrupt occlusion of the proximal left P2 segment.  2. No other large or proximal arterial branch occlusion identified.  3. Short-segment severe right A2 stenosis.  4. Multifocal atheromatous irregularity involving the distal MCA branches bilaterally.  5. Short-segment atheromatous stenosis of approximately 35% at the proximal left ICA.   MRI  Brain W/o Contrast  05/30/2016 1. Moderately motion degraded examination. Acute large LEFT PCA territory infarct including LEFT thalamus with petechial hemorrhage. 2. Subacute LEFT MCA territory infarct involving LEFT basal ganglia and LEFT frontal lobe with petechial hemorrhage. 3. Old RIGHT basal ganglia infarct. 4. Mild chronic small vessel ischemic disease. Mild to moderate parenchymal brain volume loss.  EEG  06/01/16. Diffuse slowing with runs of rhythmic spikes and polyspike wave activity in the left occipital region lasting 30 seconds which may represent seizure activity  EEG 06/03/2016 Impression:  This is an abnormal EEG due to moderate to severe diffuse generalized slowing consisting of low voltage delta activity with periods of suppression. No epileptiform abnormalities are noted. There is no evidence of seizure activity on this recording. Clinical correlation: This pattern is consistent with a global encephalopathic picture, nonspecific as to etiology. This pattern can be seen with sedative medications, metabolic derangements, neurodegenerative disorders, and acute CNS insults including ischemia and infection. Clinical correlation is required.  The abnormalities seen in the left occipital lobe on previous EEG are not present on today's study.   TTE - Left ventricle: The cavity size was normal. Wall thickness was   increased in a pattern of moderate LVH. Systolic function was   normal. The estimated ejection fraction was in the range of 55%   to 60%. Wall motion was normal; there were no regional wall   motion abnormalities. Doppler parameters are consistent with   restrictive physiology, indicative of decreased left ventricular   diastolic compliance and/or increased left atrial pressure. - Mitral valve: Calcified annulus. - Left atrium: The atrium was severely dilated. Impressions: - Technically difficult; definity used; normal LV systolic   function; moderate LVH;  restrictive filling severe LAE.  EEG - diffuse cerebral dysfunction that is non-specific in etiology and can be seen with hypoxic/ischemic injury, toxic/metabolic encephalopathies, neurodegenerative disorders, or medication effect.  The absence of epileptiform discharges does not rule out a clinical diagnosis of epilepsy.  Clinical correlation is advised.   PHYSICAL EXAM  Temp:  [98.9 F (37.2 C)-100.8 F (38.2 C)] 100.8 F (38.2 C) (01/31 1200) Pulse Rate:  [34-119] 89 (01/31 1500) Resp:  [9-30] 18 (01/31 1500) BP: (137-187)/(80-99) 182/98 (01/31 1500) SpO2:  [91 %-100 %] 91 % (01/31 1500) FiO2 (%):  [30 %] 30 % (01/31 1157) Weight:  [265 lb 14 oz (120.6 kg)] 265 lb 14 oz (120.6 kg) (01/31 0500)  General - obese, well developed, intubated off sedation.  Ophthalmologic - Fundi not visualized  Cardiovascular - irregularly irregular heart rate and rhythm with RVR.  Neuro - intubated off sedation, not responding to voice, not open eyes, PERRL, bilateral pupil 3mm, reactive to light, weak and slow doll's eyes, weak corneal R>L, positive gag. On pain stimulation, LUE trace withdraw, LLE trace toe DF.  No movement on RUE and RLE. DTR diminished and no babinski. Sensation, coordination, and gait not tested.     ASSESSMENT/PLAN Ryan Anderson is a 61 y.o. male with history of HTN, new onset atrial fibrillation without anticoagulation, hyperglycemia, and obesity presenting with dysarthria with right sided weakness. He received IV t-PA Monday, Jul 23, 2016, at 2330.   Stroke:  Left PCA large infarct embolic secondary to atrial fibrillation not on AC.  Resultant  Intubated, in coma  MRI - Large left PCA infarct and subacute left MCA and basal ganglia and frontal lobe infarcts. Old right basal ganglia infarct.   CTA H&N - occlusion of the proximal left P2 segment.   2D Echo - EF 55% to 60%  LDL - 106  HgbA1c - 8.8   VTE prophylaxis - lovenox Diet NPO time specified  No  antithrombotic prior to admission, now on ASA 325mg . No AC for now due to large left PCA infarct.  Ongoing aggressive stroke risk factor management  Therapy recommendations: pending  Disposition:  Pending daughter decision about trach/PEG or comfort care  Cerebral edema  Mild mid line shift  Was on but now off 3% saline  Na 157->159->162->160->162->158->154  Free water increased to 300ml Q3h  Repeat CT head - Acute left PCA territory infarct with increased swelling. Midline shift measures 6 mm.  AMS with coma  clinical picture worse than expected from MRI findings  EEG showed runs of rhythmic spikes and polyspike wave activity in the left occipital region lasting 30 seconds  depakote discontinued due to elevated ammonia level  Ammonia 42 -> 55 -> 50  depakote level 69->71->76->63  On keppra to 1000mg  bid  Repeat EEG x 2 - diffuse slow without seizure activity  afib with RVR  New diagnosis  Not on AC PTA  No AC for now due to large infarct  Still has RVR, on metoprolol 75 mg bid and cardizem 60mg  Q6h  CXR - RLL pneumonia  Fever  Tmax 100.7  Leukocytosis - 15.7 -> 12.9 ->14.5->12.3->12.6  CXR - CHF and suspected asp pneumonia.  UA neg x 2  Blood Cx NGTD  Sputum Cx MRSA  On rocephin now  CCM on board  DM - not in control  A1C 8.8, goal < 7.0  SSI  Hyperglycemia still on CBG monitoring  TF @ 15cc/h  increase lantus to 30 units daily  Hypertension  off Cardene now Increased metoprolol dose to 75 bid On lisinopril 20 bid  Hyperlipidemia  Home meds:  No lipid lowering medications prior to admission.  LDL 106, goal < 70  on lipitor 40mg   Continue statin at discharge  Other Stroke Risk Factors  Advanced age  ETOH use, advised to drink no more than 2 drink per day  Obesity, Body mass index is 39.38 kg/m., recommend weight loss, diet and exercise as appropriate   Hx stroke/TIA - remote basal ganglia infarct by  imaging  Other Active Problems  Hypokalemia - 3.4 ->  3.1 -> 3.3->3.9  AKI - 1.37 -> 1.06->1.30->1.37 ->1.18->1.10   Hospital day # 8  This patient is critically ill due to left PCA large infarct, afib with RVR, hyperglycemia, intubated and at significant risk of neurological worsening, death form recurrent stroke, hemorrhagic transformation, heart failure, seizure. This patient's care requires constant monitoring of vital signs, hemodynamics, respiratory and cardiac monitoring, review of multiple databases, neurological assessment, discussion with family, other specialists and medical decision making of high complexity. I had long discussion with sister and  son today, informed her about pt current condition, treatment options, and potential prognosis. They would like to talk to daughter Vernona Rieger for final decision. I spent 35 minutes of neurocritical care time in the care of this patient.  Marvel Plan, MD PhD Stroke Neurology 06/01/2016 4:00 PM   To contact Stroke Continuity provider, please refer to [email or web address]. After hours, contact General Neurology

## 2016-06-08 NOTE — Progress Notes (Signed)
eLink Physician-Brief Progress Note Patient Name: Ryan LatinSamuel G Wormley DOB: [birth year] MRN: [id number]   Date of Service  05/14/2016  HPI/Events of Note  Case discussed with Dr Tyson AliasFeinstein, patient is comfort care measures only  eICU Interventions  Will place Withdrawal of Life sustaining measures as per instructions by Dr Tyson AliasFeinstein     Intervention Category Evaluation Type: Other  Erin FullingKurian Albert Devaul 06/06/2016, 5:14 PM

## 2016-06-08 NOTE — Plan of Care (Signed)
Discussed with pt daughter Vernona RiegerLaura who is living in [place] now. Updated her about pt current diagnosis, medical condition, treatment options and poor prognosis. Daughter expressed his father would never want to live in a condition with no chance of meaningful recovery or with no or poor quality of life. She requested comfort care given the nature of the condition is serious with poor prognosis. As per family wishes we will initiate comfort care and stop any life prolonging therapies. Daughter stated that she will not be present for the withdraw process.  Marvel PlanJindong Cardin Nitschke, MD PhD Stroke Neurology 05/09/2016 3:59 PM

## 2016-06-08 NOTE — Progress Notes (Signed)
Patient assessed for respirations and heart sounds auscultated by Bartholomew CrewsAmanda Alden Bensinger, RN and [place] Allen, RN. Time of death noted at 2028. Emotional support provided for family, family's personal pastor at bedside. Medical examiner and CDS both notified of patient time of death. Dr Otelia LimesLindzen notified and to sign death certificate.  Morphine 200 mL and Ativan 25 mL wasted with Antonieta IbaSavannah Jenkins, RN

## 2016-06-08 DEATH — deceased

## 2016-06-09 LAB — CULTURE, BLOOD (ROUTINE X 2)
Culture: NO GROWTH
Culture: NO GROWTH

## 2016-07-06 NOTE — Discharge Summary (Signed)
Stroke Discharge Summary  Patient ID: Ryan Anderson   MRN: [id number]      DOB: Jan 12, 1956  Date of Admission: 2016-06-22 Date of Discharge: 06/10/2016  Attending Physician:  No att. providers found, Stroke MD Consulting Physician(s):    cardiology and pulmonary/intensive care  Patient's PCP:  Crawford Givens, MD  DISCHARGE DIAGNOSIS: left large PCA infarct, hyperglycemia, fever, aspiration pneumonia, ventilation dependent, abnormal EEG Principal Problem:   CVA (cerebral vascular accident) [facility]) Active Problems:   Essential hypertension   Atrial fibrillation (HCC)   Cerebrovascular accident (CVA) due to embolism of left middle cerebral artery (HCC)   Acute diastolic CHF (congestive heart failure) (HCC)   Atrial fibrillation with RVR (HCC)   Morbid obesity (HCC)   AKI (acute kidney injury) (HCC)   Leukocytosis   Expressive aphasia   Right flaccid hemiplegia (HCC)   Seizures (HCC)   Acute respiratory failure (HCC)   Abnormal EEG  BMI: Body mass index is 39.38 kg/m.  Past Medical History:  Diagnosis Date  . Brachial neuritis    Right-RESOLVED-([place])  . Depressive disorder, not elsewhere classified    prev treated with celexa after wife died October 07, 2008  . Disturbance of skin sensation   . History of nephrolithiasis   . Hypogonadism male    BORDERLINE  . Insomnia, unspecified   . Obesity, unspecified   . Other chest pain   . Routine general medical examination at a health care facility   . Screening examination for venereal disease   . Unspecified essential hypertension    Past Surgical History:  Procedure Laterality Date  . TONSILLECTOMY AND ADENOIDECTOMY  1962    Allergies as of 06/05/2016   No Known Allergies     Medication List    You have not been prescribed any medications.     LABORATORY STUDIES CBC    Component Value Date/Time   WBC 12.6 (H) 06/02/2016 0315   RBC 4.54 05/16/2016 0315   HGB 14.1 05/24/2016 0315   HCT 45.1 06/02/2016 0315   PLT 176  05/12/2016 0315   MCV 99.3 06/02/2016 0315   MCH 31.1 06/06/2016 0315   MCHC 31.3 05/13/2016 0315   RDW 13.3 05/25/2016 0315   LYMPHSABS 2.3 2016/06/22 2315   MONOABS 0.8 06/22/2016 2315   EOSABS 0.3 22-Jun-2016 2315   BASOSABS 0.0 06-22-2016 2315   CMP    Component Value Date/Time   NA 153 (H) 05/25/2016 1355   K 3.4 (L) 05/24/2016 0315   CL 117 (H) 05/29/2016 0315   CO2 29 06/05/2016 0315   GLUCOSE 222 (H) 06/06/2016 0315   BUN 41 (H) 05/30/2016 0315   CREATININE 1.10 05/19/2016 0315   CALCIUM 8.4 (L) 05/27/2016 0315   PROT 7.3 06-22-2016 2315   ALBUMIN 3.7 22-Jun-2016 2315   AST 37 06/22/16 2315   ALT 31 2016/06/22 2315   ALKPHOS 103 06/22/16 2315   BILITOT 0.7 06-22-16 2315   GFRNONAA >60 05/19/2016 0315   GFRAA >60 05/16/2016 0315   COAGS Lab Results  Component Value Date   INR 0.95 06/22/16   Lipid Panel    Component Value Date/Time   CHOL 159 05/30/2016 0340   TRIG 161 (H) 05/20/2016 0315   HDL 41 05/30/2016 0340   CHOLHDL 3.9 05/30/2016 0340   VLDL 12 05/30/2016 0340   LDLCALC 106 (H) 05/30/2016 0340   HgbA1C  Lab Results  Component Value Date   HGBA1C 8.7 (H) 05/31/2016   Cardiac Panel (last 3 results)  No results for input(s): CKTOTAL, CKMB, TROPONINI, RELINDX in the last 72 hours. Urinalysis    Component Value Date/Time   COLORURINE YELLOW 06/04/2016 0933   APPEARANCEUR CLEAR 06/04/2016 0933   LABSPEC 1.030 06/04/2016 0933   PHURINE 5.0 06/04/2016 0933   GLUCOSEU 50 (A) 06/04/2016 0933   HGBUR MODERATE (A) 06/04/2016 0933   BILIRUBINUR NEGATIVE 06/04/2016 0933   KETONESUR 5 (A) 06/04/2016 0933   PROTEINUR 30 (A) 06/04/2016 0933   NITRITE NEGATIVE 06/04/2016 0933   LEUKOCYTESUR NEGATIVE 06/04/2016 0933   Urine Drug Screen     Component Value Date/Time   LABOPIA NONE DETECTED 05/20/2016 2336   COCAINSCRNUR NONE DETECTED 05/31/2016 2336   LABBENZ NONE DETECTED 05/25/2016 2336   AMPHETMU NONE DETECTED 05/09/2016 2336   THCU NONE  DETECTED 05/20/2016 2336   LABBARB NONE DETECTED 05/23/2016 2336    Alcohol Level    Component Value Date/Time   ETH <5 06/04/2016 2315     SIGNIFICANT DIAGNOSTIC STUDIES Dg Chest Port 1 View 06/05/2016 IMPRESSION: Slight improvement in the appearance of the pulmonary interstitium and both lung bases. Persistent hypoinflation with mild CHF. The support tubes are in reasonable position.   Ct Head Wo Contrast 06/03/2016 Acute left PCA territory infarct with increased swelling.  Midline shift measures 6 mm; no herniation or ventricular entrapment. Hemorrhagic conversion is minimally increased, no focal hematoma.  06/01/2016 Acute left PCA territory infarction. More low-density and swelling in the region involved.  Petechial blood products are present, without frank focal hematoma.  Increased swelling results in mass effect with left-to-right shift of 5 mm. No ventricular trapping.  05/30/2016 1. Similar appearance of evolving left MCA territory infarcts involving the left basal ganglia and probable anterior left frontal lobe.  2. Subtle loss of gray-white matter differentiation within the parasagittal left tempo-occipital region, suspicious for evolving left PCA territory infarct. This is compatible with previously identified left P2 occlusion.  3. No other new acute intracranial process identified.   05/14/2016 1. Acute evolving left basal ganglia infarct as above, with additional possible patchy involvement within the supra ganglionic anterior left frontal lobe.  2. ASPECTS is 6  3. Remote right basal ganglia infarct.  4. Mild chronic small vessel ischemic disease.   Ct Angio Head and Neck W Or Wo Contrast 05/30/2016 1. Abrupt occlusion of the proximal left P2 segment.  2. No other large or proximal arterial branch occlusion identified.  3. Short-segment severe right A2 stenosis.  4. Multifocal atheromatous irregularity involving the distal MCA branches bilaterally.  5.  Short-segment atheromatous stenosis of approximately 35% at the proximal left ICA.   MRI Brain W/o Contrast  05/30/2016 1. Moderately motion degraded examination. Acute large LEFT PCA territory infarct including LEFT thalamus with petechial hemorrhage. 2. Subacute LEFT MCA territory infarct involving LEFT basal ganglia and LEFT frontal lobe with petechial hemorrhage. 3. Old RIGHT basal ganglia infarct. 4. Mild chronic small vessel ischemic disease. Mild to moderate parenchymal brain volume loss.  EEG  06/01/16. Diffuse slowing with runs of rhythmic spikes and polyspike wave activity in the left occipital region lasting 30 seconds which may represent seizure activity  EEG 06/03/2016 Impression: This is an abnormal EEG due to moderate to severe diffuse generalized slowing consisting of low voltage delta activity with periods of suppression. No epileptiform abnormalities are noted. There is no evidence of seizure activity on this recording. Clinical correlation: This pattern is consistent with a global encephalopathic picture, nonspecific as to etiology. This pattern can be seen with sedative  medications, metabolic derangements, neurodegenerative disorders, andacute CNS insults including ischemia and infection. Clinical correlation is required.  The abnormalities seen in the left occipital lobe on previous EEG are not present on today's study.   TTE - Left ventricle: The cavity size was normal. Wall thickness was increased in a pattern of moderate LVH. Systolic function was normal. The estimated ejection fraction was in the range of 55% to 60%. Wall motion was normal; there were no regional wall motion abnormalities. Doppler parameters are consistent with restrictive physiology, indicative of decreased left ventricular diastolic compliance and/or increased left atrial pressure. - Mitral valve: Calcified annulus. - Left atrium: The atrium was severely dilated. Impressions: -  Technically difficult; definity used; normal LV systolic function; moderate LVH; restrictive filling severe LAE.  EEG - diffuse cerebral dysfunction that is non-specific in etiology and can be seen with hypoxic/ischemic injury, toxic/metabolic encephalopathies, neurodegenerative disorders, or medication effect. The absence of epileptiform discharges does not rule out a clinical diagnosis of epilepsy. Clinical correlation is advised.    HISTORY OF PRESENT ILLNESS Ryan Anderson is an 61 y.o. male with a history of hypertension and obesity, brought to the ED in code stroke status following acute onset of weakness involving right face and extremities along with slurred speech. Onset was at 9:45 PM. He has no previous history of stroke nor TIA. He was found to be in atrial fibrillation with RVR which had not previously been recognized. His blood pressure was markedly elevated and he had hyperglycemia in the 200s. 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 CT angiogram showed proximal left P2 PCA occlusion. There was no MCA occlusion. Patient was deemed a candidate for TPA which was administered after getting his blood pressure controlled which required Cardene drip after labetalol roof to be ineffective. There was worsening of his speech with onset of expressive aphasia during TPA infusion. TPA was suspended and repeat CT scan of the head was obtained which showed no signs of acute hemorrhage. TPA was resumed. Patient was not deemed a candidate for IR intervention, and was admitted to neuro ICU for further management.  LSN: 9:45 PM on 31-Dec-2016 tPA Given: Yes. tPA was delayed because of effort required to manage patient's hypertensive urgency. mRankin:   HOSPITAL COURSE Ryan Anderson is a 61 y.o. male with history of HTN, new onset atrial fibrillation without anticoagulation, hyperglycemia,  and obesity presenting with dysarthria with right sided weakness. He received IV t-PA Monday, 31-Dec-2016, at 2330.   Stroke:  Left PCA large infarct embolic secondary to atrial fibrillation not on AC.  Resultant  Intubated, in coma  MRI - Large left PCA infarct and subacute left MCA and basal ganglia and frontal lobe infarcts. Old right basal ganglia infarct.   CTA H&N - occlusion of the proximal left P2 segment.  2D Echo - EF 55% to 60%  LDL - 106  HgbA1c - 8.8   VTE prophylaxis - lovenox  Diet NPO time specified  No antithrombotic prior to admission, now on ASA 325mg . No AC for now due to large left PCA infarct.  Disposition:  discussed with daughter over the phone, and decision made for comfort care  Cerebral edema  Mild mid line shift  Was on but now off 3% saline  Na 157->159->162->160->162->158->154  Repeat CT head - Acute left PCA territory infarct with increased swelling. Midline shift measures 6 mm.  AMS with coma  clinical picture worse than expected from MRI findings  EEG showed runs of rhythmic spikes and polyspike wave activity in the left occipital region lasting 30 seconds  depakote discontinued due to elevated ammonia level  Ammonia 42 -> 55 -> 50  depakote level 69->71->76->63  On keppra to 1000mg  bid  Repeat EEG x 2 - diffuse slow without seizure activity  afib with RVR  New diagnosis  Not on AC PTA  No AC for now due to large infarct  Still has RVR, on metoprolol 75 mg bid and cardizem 60mg  Q6h  CXR - RLL pneumonia  Fever  Tmax 100.7  Leukocytosis - 15.7 -> 12.9 ->14.5->12.3->12.6  CXR - CHF and suspected asp pneumonia.  UA neg x 2  Blood Cx NGTD  Sputum Cx MRSA  On rocephin now  CCM on board  DM - not in control  A1C 8.8, goal < 7.0  SSI  Hyperglycemia still on CBG monitoring  TF @ 15cc/h  increase lantus to 30 units daily  Hypertension  off Cardene now  Increased metoprolol dose to 75  bid  On lisinopril 20 bid  Hyperlipidemia  Home meds:  No lipid lowering medications prior to admission.  LDL 106, goal < 70  on lipitor 40mg   Other Stroke Risk Factors  Advanced age  ETOH use, advised to drink no more than 2 drink per day  Obesity, Body mass index is 39.38 kg/m., recommend weight loss, diet and exercise as appropriate   Hx stroke/TIA - remote basal ganglia infarct by imaging  Other Active Problems  Hypokalemia - 3.4 ->  3.1 -> 3.3->3.9  AKI - 1.37 -> 1.06->1.30->1.37 ->1.18->1.10  DISCHARGE EXAM Pt deceased  Marvel Plan, MD PhD Stroke Neurology 06/10/2016 11:58 AM

## 2018-06-26 IMAGING — CT CT HEAD W/O CM
4 series · 16 of 47 positions shown, 18 images · non-contrast
Comparison: 06/01/2016

CLINICAL DATA: Acute ischemic stroke.

EXAM:
CT HEAD WITHOUT CONTRAST
TECHNIQUE: Contiguous axial images were obtained from the base of the skull
through the vertex without intravenous contrast.

[Series 2: head without · axial · non-contrast · 0.46mm/px · z∈[-221,-101]mm · 7 of 34 slices shown, 9 images]
[im 5/34  brain]
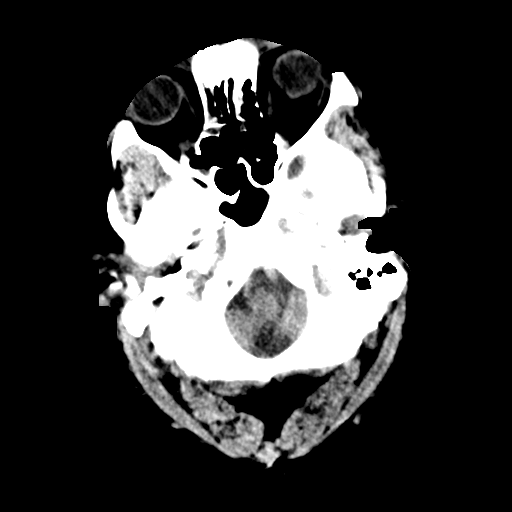
[im 5/34  bone]
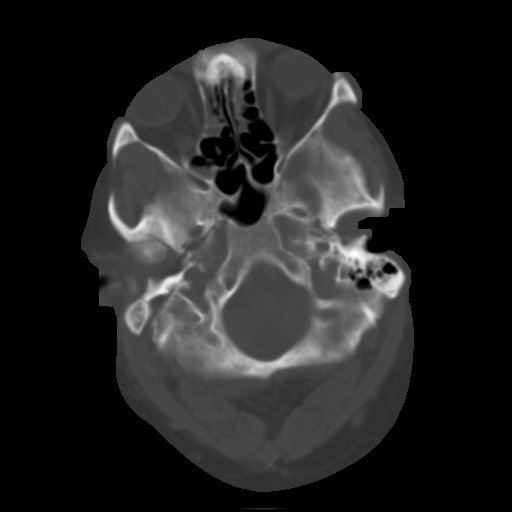
[im 9/34  brain]
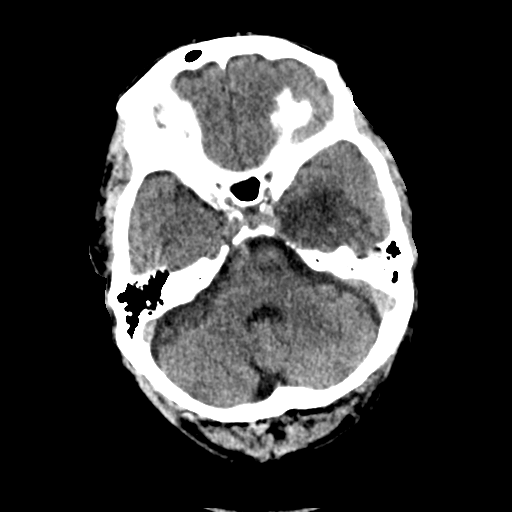
[im 13/34  brain]
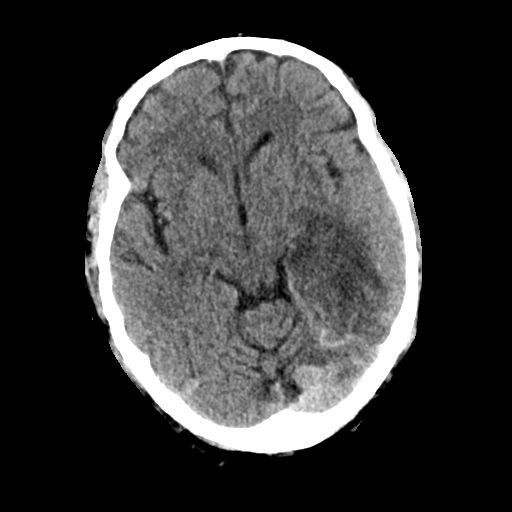
[im 17/34  brain]
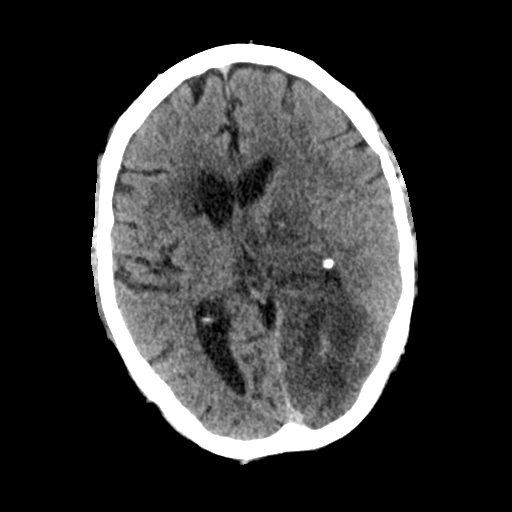
[im 21/34  brain]
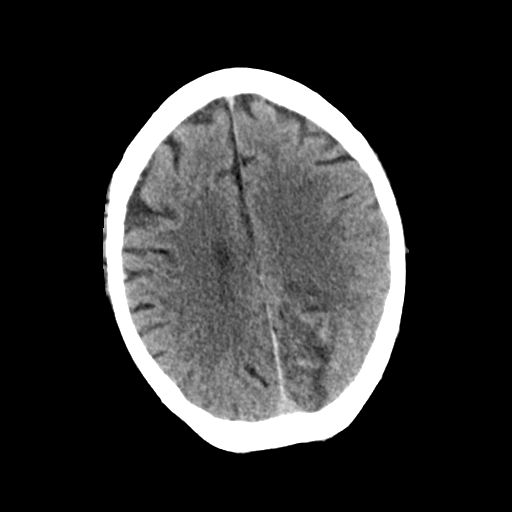
[im 21/34  bone]
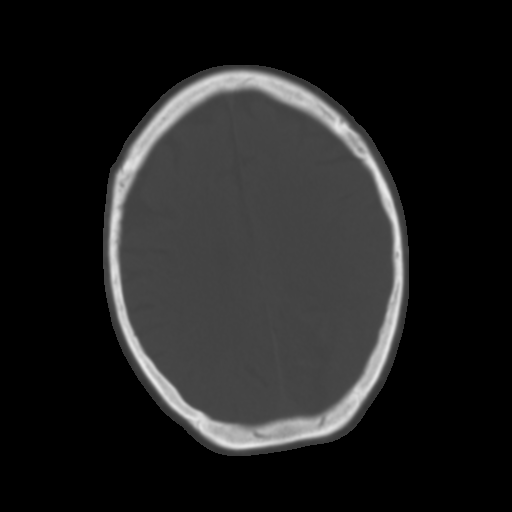
[im 25/34  brain]
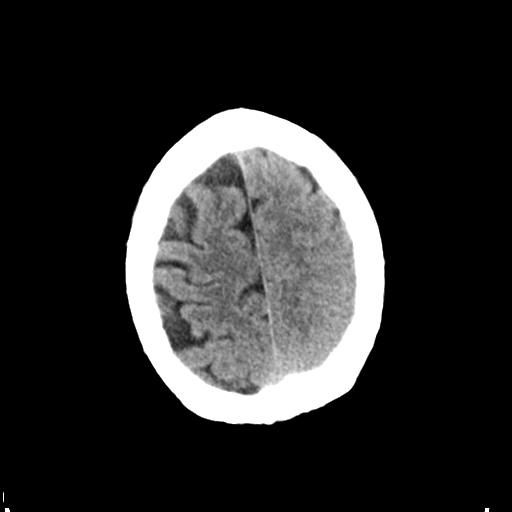
[im 29/34  brain]
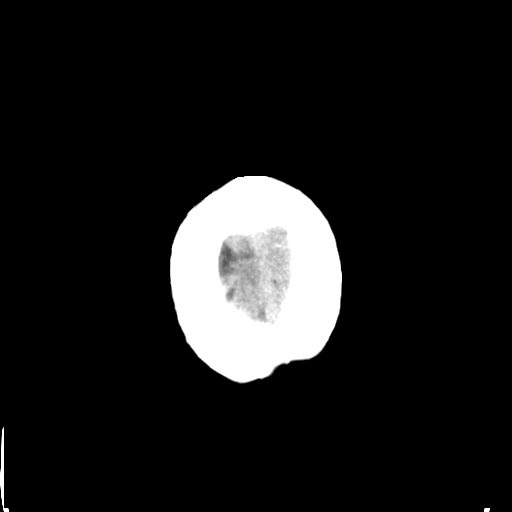

[Series 3: head bone · axial · 0.46mm/px · z∈[-225,-193]mm · 3 of 84 slices shown]
[im 9/84  bone]
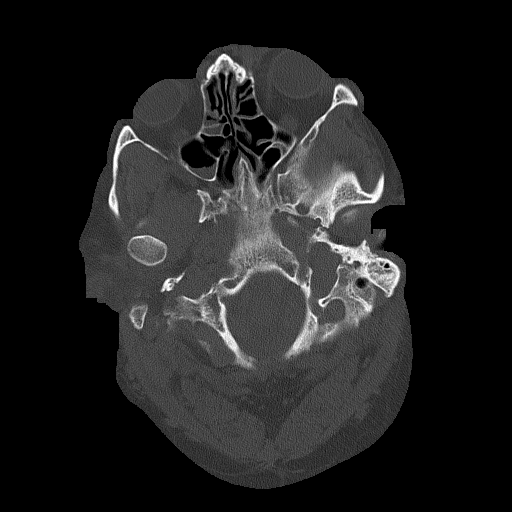
[im 17/84  bone]
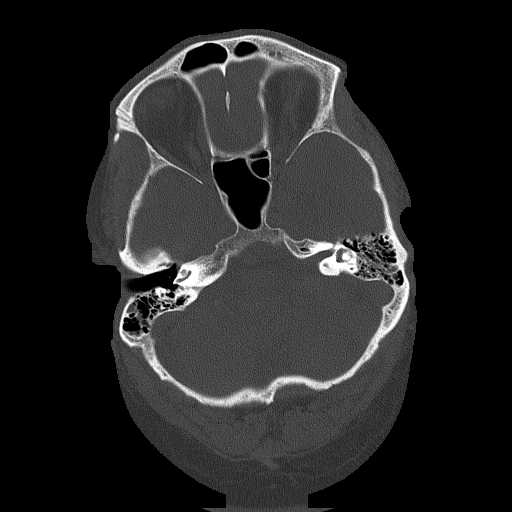
[im 25/84  bone]
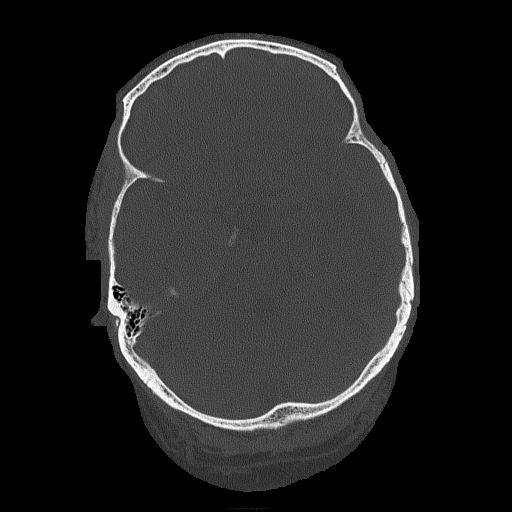

[Series 4: head without cor · coronal · non-contrast · 0.33mm/px · 3 of 67 slices shown]
[im 23/67  brain]
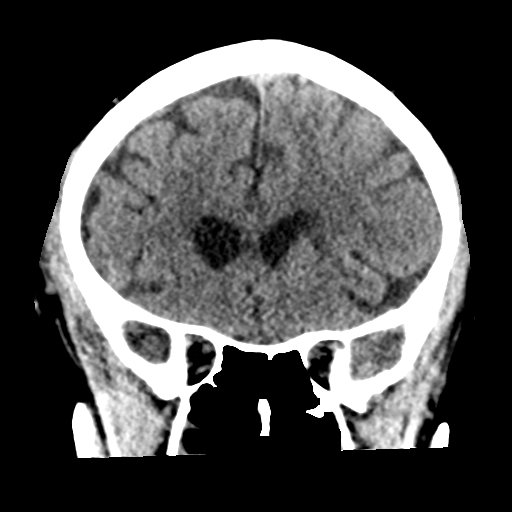
[im 30/67  brain]
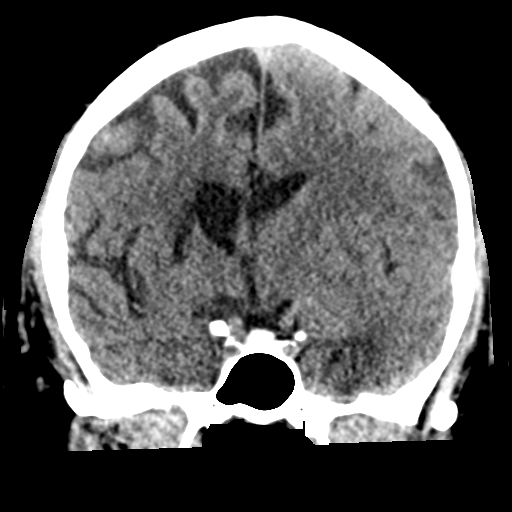
[im 37/67  brain]
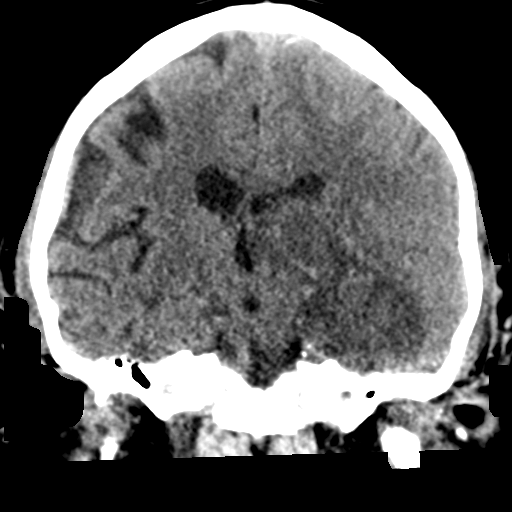

[Series 5: head without sag · sagittal · non-contrast · 0.33mm/px · 3 of 67 slices shown]
[im 23/67  brain]
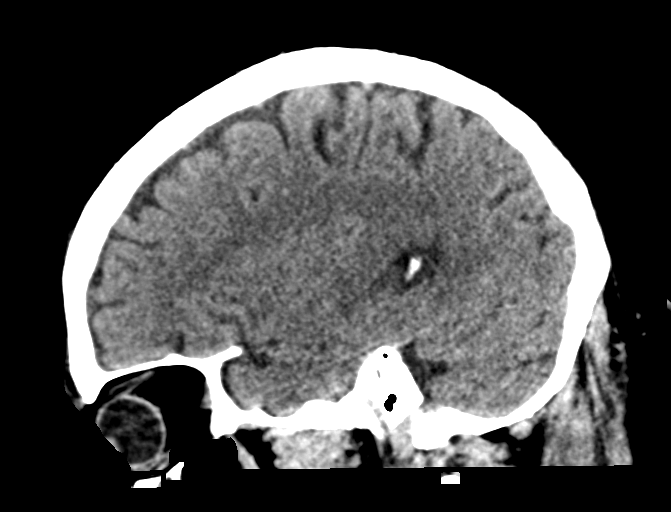
[im 34/67  brain]
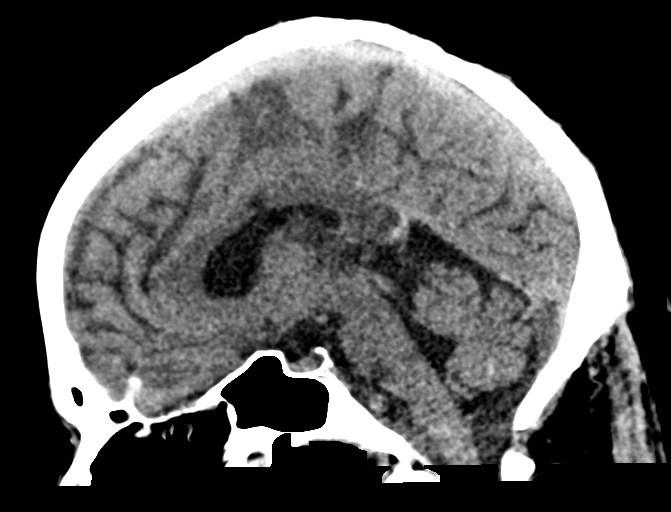
[im 45/67  brain]
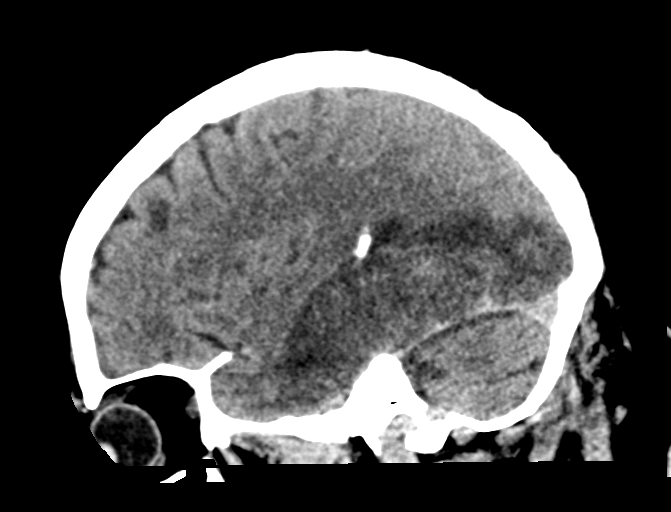

[16 of 47 positions shown; findings below may reference images not displayed]

FINDINGS: Brain: Acute infarct throughout the left PCA territory with
hemorrhagic conversion patchy along the cortex and in the thalamus.
Hemorrhage is mildly increased, without focal hematoma. Midline
shift measures 6 mm at the septum pellucidum, 1 mm greater than
before. No uncal or subfalcine herniation. Remote perforator
infarcts in the bilateral lenticulostriate distribution. No
hydrocephalus/entrapment.

Vascular: No hyperdense vessel noted.

Skull: No acute finding

Sinuses/Orbits: Negative

These results were called by telephone at the time of interpretation
on 06/03/2016 at [DATE] to Dr. DERMAN SECHLAGER LAPENG , who verbally
acknowledged these results.
IMPRESSION: Acute left PCA territory infarct with increased swelling. Midline
shift measures 6 mm; no herniation or ventricular entrapment.
Hemorrhagic conversion is minimally increased, no focal hematoma.

## 2018-06-28 IMAGING — CR DG CHEST 1V PORT
1 series · 1 of 1 positions shown · non-contrast
Comparison: Portable chest x-ray June 04, 2016

CLINICAL DATA: Acute respiratory failure, intubated patient.
History of hypertension, atrial fibrillation, CVA, acute CHF and
respiratory failure, morbid obesity, nonsmoker.

EXAM:
PORTABLE CHEST 1 VIEW

[AP]
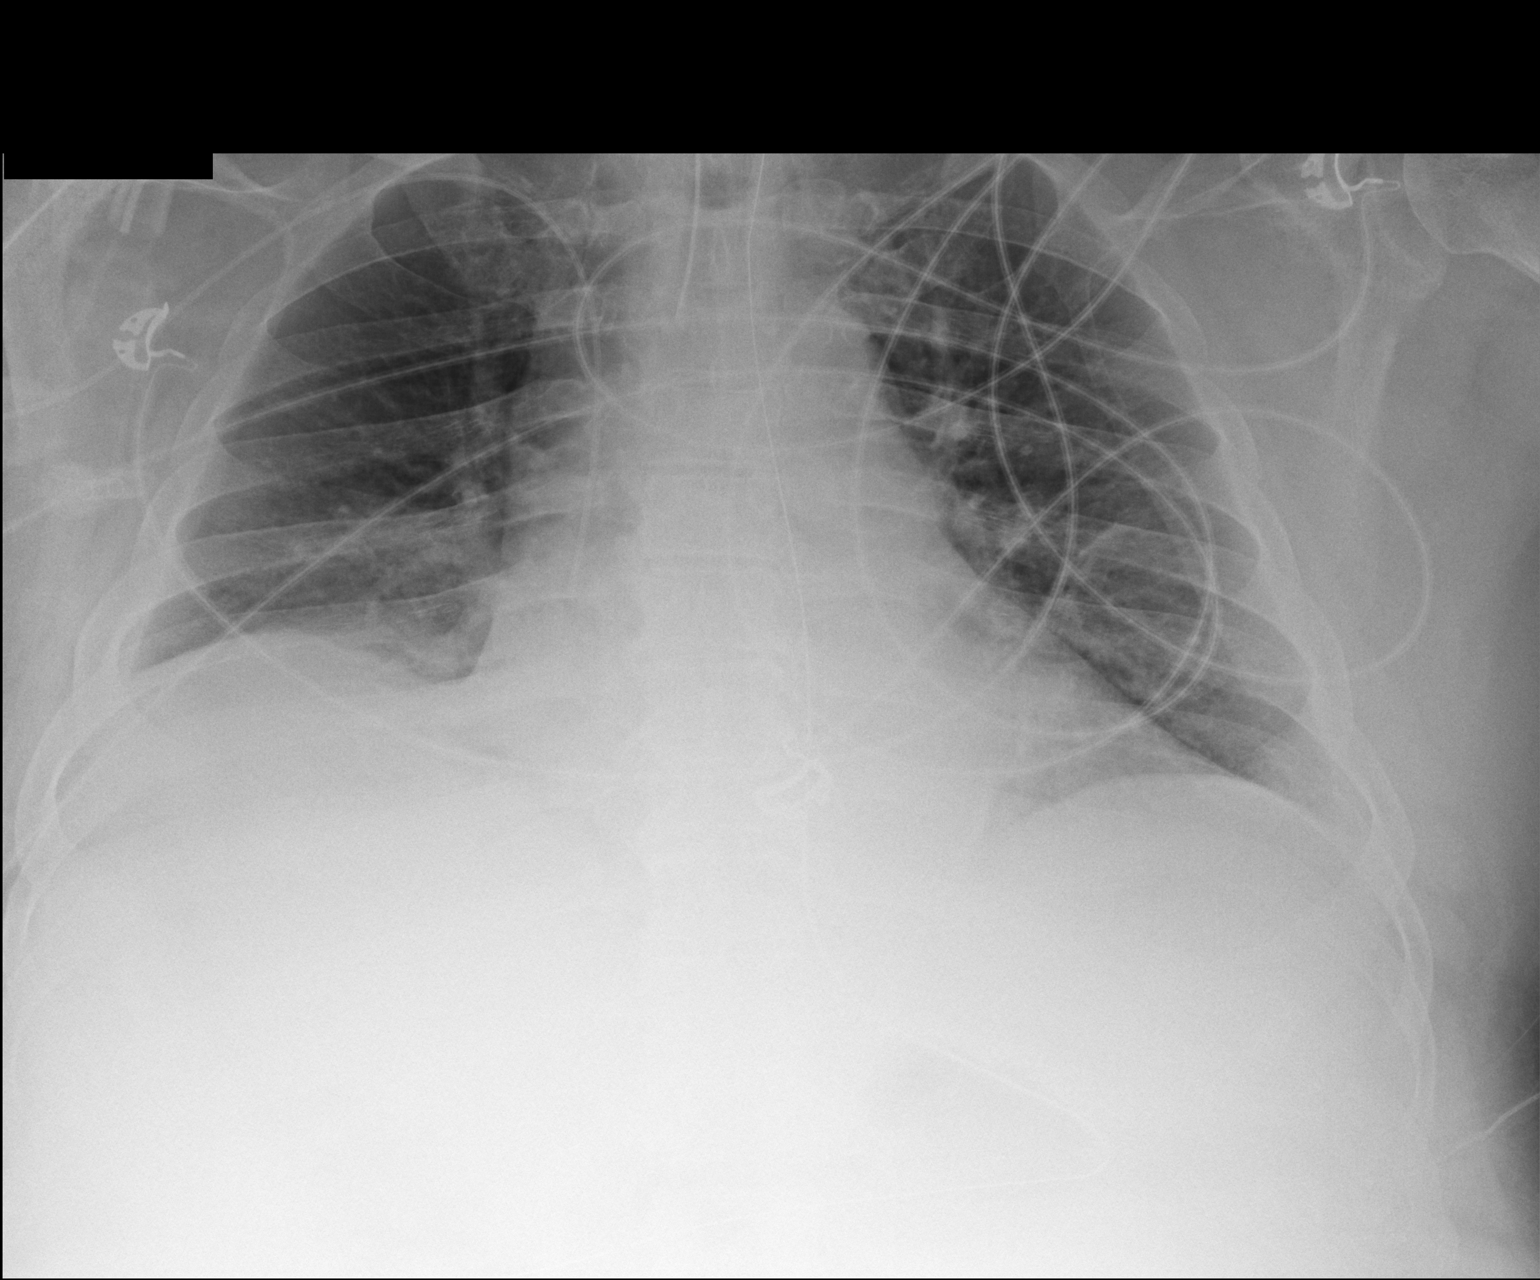

[1 of 1 positions shown; findings below may reference images not displayed]

FINDINGS: The lungs are mildly hypoinflated. Atelectasis at the right lung
base has improved. Small amounts of pleural fluid are present
greatest on the right. The cardiac silhouette remains enlarged. The
pulmonary vascularity is less engorged and more distinct. The
right-sided PICC line tip projects over the midportion of the SVC.
The endotracheal tube tip lies 4 cm above the carina. The
esophagogastric tube tip in proximal port lie below the GE junction.
IMPRESSION: Slight improvement in the appearance of the pulmonary interstitium
and both lung bases. Persistent hypoinflation with mild CHF. The
support tubes are in reasonable position.
